# Patient Record
Sex: Female | Born: 1955 | Race: White | Hispanic: No | Smoking: Never smoker
Health system: Southern US, Community
[De-identification: ages and names within clinical notes are randomized; demographics above are authoritative.]

## PROBLEM LIST (undated history)

## (undated) DIAGNOSIS — N764 Abscess of vulva: Secondary | ICD-10-CM

## (undated) DIAGNOSIS — N946 Dysmenorrhea, unspecified: Secondary | ICD-10-CM

## (undated) DIAGNOSIS — N926 Irregular menstruation, unspecified: Secondary | ICD-10-CM

## (undated) HISTORY — DX: Dysmenorrhea, unspecified: N94.6

## (undated) HISTORY — DX: Abscess of vulva: N76.4

## (undated) HISTORY — DX: Irregular menstruation, unspecified: N92.6

---

## 1998-06-15 ENCOUNTER — Ambulatory Visit (HOSPITAL_COMMUNITY): Admission: RE | Admit: 1998-06-15 | Discharge: 1998-06-15 | Payer: Self-pay | Admitting: *Deleted

## 1998-06-30 HISTORY — PX: BREAST SURGERY: SHX581

## 1998-07-30 ENCOUNTER — Ambulatory Visit (HOSPITAL_COMMUNITY): Admission: RE | Admit: 1998-07-30 | Discharge: 1998-07-30 | Payer: Self-pay | Admitting: *Deleted

## 1999-06-02 ENCOUNTER — Other Ambulatory Visit: Admission: RE | Admit: 1999-06-02 | Discharge: 1999-06-02 | Payer: Self-pay | Admitting: Obstetrics and Gynecology

## 1999-06-22 ENCOUNTER — Other Ambulatory Visit: Admission: RE | Admit: 1999-06-22 | Discharge: 1999-06-22 | Payer: Self-pay | Admitting: Obstetrics and Gynecology

## 2000-06-28 ENCOUNTER — Other Ambulatory Visit: Admission: RE | Admit: 2000-06-28 | Discharge: 2000-06-28 | Payer: Self-pay | Admitting: Obstetrics and Gynecology

## 2001-06-28 ENCOUNTER — Other Ambulatory Visit: Admission: RE | Admit: 2001-06-28 | Discharge: 2001-06-28 | Payer: Self-pay | Admitting: Obstetrics and Gynecology

## 2002-07-18 ENCOUNTER — Other Ambulatory Visit: Admission: RE | Admit: 2002-07-18 | Discharge: 2002-07-18 | Payer: Self-pay | Admitting: Obstetrics and Gynecology

## 2003-07-29 ENCOUNTER — Other Ambulatory Visit: Admission: RE | Admit: 2003-07-29 | Discharge: 2003-07-29 | Payer: Self-pay | Admitting: Obstetrics and Gynecology

## 2004-08-16 ENCOUNTER — Other Ambulatory Visit: Admission: RE | Admit: 2004-08-16 | Discharge: 2004-08-16 | Payer: Self-pay | Admitting: Obstetrics and Gynecology

## 2005-01-05 HISTORY — PX: ENDOMETRIAL BIOPSY: SHX622

## 2005-05-16 ENCOUNTER — Encounter (INDEPENDENT_AMBULATORY_CARE_PROVIDER_SITE_OTHER): Payer: Self-pay | Admitting: *Deleted

## 2005-05-16 ENCOUNTER — Ambulatory Visit (HOSPITAL_BASED_OUTPATIENT_CLINIC_OR_DEPARTMENT_OTHER): Admission: RE | Admit: 2005-05-16 | Discharge: 2005-05-16 | Payer: Self-pay | Admitting: Obstetrics and Gynecology

## 2005-05-16 ENCOUNTER — Ambulatory Visit (HOSPITAL_COMMUNITY): Admission: RE | Admit: 2005-05-16 | Discharge: 2005-05-16 | Payer: Self-pay | Admitting: Obstetrics and Gynecology

## 2005-05-16 HISTORY — PX: CERVICAL POLYPECTOMY: SHX88

## 2005-05-16 HISTORY — PX: TUBAL LIGATION: SHX77

## 2005-08-17 ENCOUNTER — Other Ambulatory Visit: Admission: RE | Admit: 2005-08-17 | Discharge: 2005-08-17 | Payer: Self-pay | Admitting: Obstetrics & Gynecology

## 2006-01-03 ENCOUNTER — Ambulatory Visit (HOSPITAL_COMMUNITY): Admission: RE | Admit: 2006-01-03 | Discharge: 2006-01-03 | Payer: Self-pay | Admitting: Family Medicine

## 2006-08-22 ENCOUNTER — Other Ambulatory Visit: Admission: RE | Admit: 2006-08-22 | Discharge: 2006-08-22 | Payer: Self-pay | Admitting: Obstetrics and Gynecology

## 2007-09-20 ENCOUNTER — Other Ambulatory Visit: Admission: RE | Admit: 2007-09-20 | Discharge: 2007-09-20 | Payer: Self-pay | Admitting: Obstetrics & Gynecology

## 2008-05-30 HISTORY — PX: COLONOSCOPY: SHX174

## 2008-06-16 ENCOUNTER — Ambulatory Visit (HOSPITAL_COMMUNITY): Admission: RE | Admit: 2008-06-16 | Discharge: 2008-06-16 | Payer: Self-pay | Admitting: Internal Medicine

## 2008-06-16 ENCOUNTER — Ambulatory Visit: Payer: Self-pay | Admitting: Internal Medicine

## 2008-10-06 ENCOUNTER — Other Ambulatory Visit: Admission: RE | Admit: 2008-10-06 | Discharge: 2008-10-06 | Payer: Self-pay | Admitting: Obstetrics & Gynecology

## 2010-04-29 HISTORY — PX: ANKLE ARTHODESIS W/ ARTHROSCOPY: SUR63

## 2010-10-12 NOTE — Op Note (Signed)
NAME:  Heather Moore, Heather Moore                ACCOUNT NO.:  [id number]   MEDICAL RECORD NO.:  [id number]          PATIENT TYPE:  AMB   LOCATION:  DAY                           FACILITY:  [facility]   PHYSICIAN:  R. Roetta Sessions, M.D. DATE OF BIRTH:  Feb 21, 1956   DATE OF PROCEDURE:  06/16/2008  DATE OF DISCHARGE:                               OPERATIVE REPORT   Ileocolonoscopy screening.   INDICATIONS FOR PROCEDURE:  A 55 year old lady void of any lower GI  tract symptoms sent over at the request of Dr. Karleen Hampshire for  colorectal cancer screening.  She has never had her lower GI tract  evaluated.  There is no family history of colon polyps or colon cancer.  Colonoscopy is now being done to establish screening maneuver.  Risks,  benefits, alternatives, and limitations to this approach have been  reviewed.  Please see the documentation in the medical record.   PROCEDURE NOTE:  O2 saturation, blood pressure, pulse, and respirations  were monitored throughout the entire procedure.   CONSCIOUS SEDATION:  Versed 4 mg IV and Demerol 75 mg IV in divided  doses.   INSTRUMENT:  Pentax video chip system.   FINDINGS:  Digital rectal exam revealed no abnormalities.  Endoscopic  findings:  The prep was good.  Colon:  Colonic mucosa was surveyed from  the rectosigmoid junction through the left transverse, right colon to  the appendiceal orifice, ileocecal valve, and cecum.  These structures  were well seen and photographed for the record.  The terminal ileum was  intubated to 5 cm.  From this level, the scope was slowly and cautiously  withdrawn.  All previously mentioned mucosal surfaces were again seen.  The colonic mucosa appeared entirely normal as did the terminal ileal  mucosa.  The scope was pulled down to the rectum, where thorough  examination of the rectal mucosa including retroflexed view of the anal  verge demonstrated only a couple of anal papilla.  The patient tolerated  the procedure well  and was reactive in Endoscopy.   IMPRESSION:  Anal papilla, otherwise normal rectum, colon, and terminal  ileum.   RECOMMENDATIONS:  Repeat screening colonoscopy in 10 years.      Jonathon Bellows, M.D.  Electronically Signed     RMR/MEDQ  D:  06/16/2008  T:  06/16/2008  Job:  [id number]   cc:   Kirk Ruths, M.D.  Fax: [phone]

## 2010-10-15 NOTE — Op Note (Signed)
NAME:  Heather Moore, Heather Moore                ACCOUNT NO.:  [id number]   MEDICAL RECORD NO.:  [id number]          PATIENT TYPE:  AMB   LOCATION:  [facility]                         FACILITY:  [facility]   PHYSICIAN:  Cynthia P. Romine, M.D.DATE OF BIRTH:  Sep 12, 1955   DATE OF PROCEDURE:  05/16/2005  DATE OF DISCHARGE:                                 OPERATIVE REPORT   PREOPERATIVE DIAGNOSIS:  Menorrhagia, desires attempt at permanent surgical  sterilization.   POSTOPERATIVE DIAGNOSIS:  Menorrhagia, desires attempt at permanent surgical  sterilization.   PROCEDURE:  Endometrial ablation using the HydroTherm ablator and  laparoscopic Falope ring bilateral tubal sterilization procedure.   SURGEON:  Dr. Purvis Sheffield   ANESTHESIA:  General endotracheal.   ESTIMATED BLOOD LOSS:  Minimal.   COMPLICATIONS:  None.   PROCEDURE:  The patient was taken to the operating room and after the  induction of adequate general endotracheal anesthesia, was placed in the  dorsolithotomy position and prepped and draped in the usual fashion.  The  bladder was drained with a red rubber catheter.  A  posterior weighted and  anterior Sims retractor were placed, and the cervix was grasped on its  anterior lip with a single-tooth tenaculum.  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  Knowing this, her cervix was injected  with Pitressin, a solution of 20 mL of Pitressin to 20 mL of normal saline,  and approximately 7 mL was injected directly into the cervical stroma.  Attempt was made to pass the sound to the endocervical os, and it would not  pass.  The endocervix was dilated to a #23 Shawnie Pons, and the hysteroscope was  introduced and could not pass through the endocervix.  The uterus took a  turn anteriorly and was anteflexed, and it made difficult for the scope to  pass into the endometrial cavity.  The cervix was dilated up  to a #25 Shawnie Pons,  and still the hysteroscope would not pass.  Therefore, attempts were  stopped, and the 25 Pratt dilator was left in the cervix while laparoscopic  tubal sterilization was done.  Attention was next turned to the abdomen.  A  subumbilical incision was made.  The Veress needle was inserted into the  peritoneal space.  Pneumoperitoneum was created using 2 L of CO2 using a  Stryker automatic insufflator.  A 10/11 trocar was then inserted and found  to be preperitoneal.  The trocar was removed with.  The Veress needle was  again inserted into the peritoneal space, and the abdomen was insufflated  with 2 L of CO2.  The 11 mm trocar was then reintroduced into the abdomen  and was in the peritoneal space.  The uterus was elevated.  The fallopian  tubes were identified.  The left fallopian tube was traced to its fimbriated  end.  It was elevated in the midportion, and the Falope ring was placed.  A  good knuckle of tube was noted to be contained within the ring, and  no  bleeding was encountered.  The procedure was repeated on the patient's  right, identifying the tube and tracing it its fimbriated end.  A good  knuckle of tube was noted to be contained within the ring, and no bleeding  was encountered.  The ovaries in the cul-de-sac, both anterior and posterior  appeared normal.  The Falope ring applier was then removed, and attention  was next turned back to the cervix.  Attempt was made to pass the  hysteroscope after taking out the [employer], and it still would not pass.  The cervix was dilated to a #27, and the scope would not pass.  It was  dilated to a #29, and finally the scope did pass into the endometrial  cavity.  Proper positioning was noted by noting the bilateral tubal ostia.  The scope was withdrawn to just inside the endocervical os; endometrial  ablation was carried out using the HydroTherm ablator in the usual  technique.  Photographic documentation was taken before  and after the  ablation.  After the ablation, the instruments were removed from the vagina.  Attention was next turned back to the abdomen.  The trocar sleeves were  removed.  The incisions were closed subcuticularly with 3-0 Vicryl Rapide,  and the procedure was terminated.  The patient was taken to recovery room in  satisfactory condition.           ______________________________  Edwena Felty. Romine, M.D.     CPR/MEDQ  D:  05/16/2005  T:  05/17/2005  Job:  [id number]

## 2012-09-20 ENCOUNTER — Encounter: Payer: Self-pay | Admitting: Nurse Practitioner

## 2012-09-20 ENCOUNTER — Ambulatory Visit (INDEPENDENT_AMBULATORY_CARE_PROVIDER_SITE_OTHER): Payer: 59 | Admitting: Nurse Practitioner

## 2012-09-20 VITALS — BP 120/70 | HR 78 | Resp 16 | Ht 63.0 in | Wt 175.0 lb

## 2012-09-20 DIAGNOSIS — L0291 Cutaneous abscess, unspecified: Secondary | ICD-10-CM

## 2012-09-20 DIAGNOSIS — L0390 Cellulitis, unspecified: Secondary | ICD-10-CM

## 2012-09-20 MED ORDER — DOXYCYCLINE HYCLATE 100 MG PO CAPS: 100.0000 mg | ORAL_CAPSULE | Freq: Two times a day (BID) | ORAL | Status: DC

## 2012-09-20 NOTE — Patient Instructions (Addendum)
Sitz baths with warm H20 3-4 times a day.  May use epsom salts. Gentle pill rolling motion to remove the exudate.  Out of work today and tomorrow. Antibiotic Doxycycline 100 mg 2 X day Recheck with Patty or Dr. Farrel Gobble next Wednesday  Or Thursday

## 2012-09-20 NOTE — Progress Notes (Signed)
Subjective:     Patient ID: Heather Moore, female   DOB: May 18, 1956, 57 y.o.   MRN: [id number]  HPI Comments: Patient states about a week ago noted a 'lesion' that she thought was an infected hair follicle on left vulva. Now on right about 5 days ago. Now feels like more lesions internally. No pain with urination. Feels swollen and uncomfortable. Last sexually active about 1- 11/2 month ago.  No fever chills. No urinary symptoms.this am also has a lesion right thumb.   Recently diagnosed with impetigo in right nostril in December and treated with antibiotic cream and oral antibiotics. No nasal culture was done.      Review of Systems  Constitutional: Negative.   Respiratory: Negative.   Cardiovascular: Negative.   Gastrointestinal: Negative.   Genitourinary: Positive for genital sores, vaginal pain and menstrual problem. Negative for dysuria, frequency, flank pain, vaginal bleeding, vaginal discharge and difficulty urinating.       LMP 05/2009. Usual vaso symptoms. Vaginal pain/ discomfort with lesions.  Psychiatric/Behavioral: Negative.        Objective:   Physical Exam  Constitutional: She appears well-developed and well-nourished.  Cardiovascular: Normal rate.   Pulmonary/Chest: Effort normal.  Abdominal: Soft. Bowel sounds are normal. She exhibits no distension and no mass. There is no tenderness. There is no rebound and no guarding.  Genitourinary:     A fullness of left vulva lesion with borders that are firm about 1 X 1.5 cm in size. No exudate. Area with swelling and induration about 3 cm total. Smaller lesion on right labia about 0.5 cm that is also firm without exudate.  Isolated other lesions that are healing and no induration. One area top of left inner leg with a bullseye look.        Assessment:    Cellulitis and abscess left vulva R/O MRSA Allergy to Sulfa    Plan:     Patient was seen by Dr. Farrel Gobble who did I & D of lesion Patient given instructions for sitz  bath.  She is given note for work today ad tomorrow.  Will recheck next week.  Will follow with wound culture results.  May take NSAID'S prn pain.Started on Doxycycline 100 mg BID.   Reviewed, TL   Addendum- I was asked to evaluate pt, findings were as noted above.  The right labia majora had a firm, mobile lesion approx 0.5cm, the left had a larger mass associated with general swelling but no induration or warmth.  The finding were discussed with the pt, suggest and I&D of the larger lesion for both culture and sensitivity and comfort.  Risks and benefits reviewed and accepted.   The labia was anesthetized with 2% xylocaine jelly, cleansed with betadine and a single stab with #11 blade results in expression of moderate amount of thick grey discharge no malodor.  A sample of the discharge was sent for C&S.  The lesion was drained of the Liquifed material. Pt tolerated well. Recommend sitz bath with pill rolling to express remaining exudate. Doxycycline called in. F/U 1w  TL

## 2012-09-23 LAB — WOUND CULTURE

## 2012-09-24 ENCOUNTER — Encounter: Payer: Self-pay | Admitting: Nurse Practitioner

## 2012-09-26 ENCOUNTER — Ambulatory Visit (INDEPENDENT_AMBULATORY_CARE_PROVIDER_SITE_OTHER): Payer: 59 | Admitting: Gynecology

## 2012-09-26 VITALS — BP 112/64 | Resp 14 | Wt 175.0 lb

## 2012-09-26 DIAGNOSIS — N9089 Other specified noninflammatory disorders of vulva and perineum: Secondary | ICD-10-CM

## 2012-09-26 DIAGNOSIS — N907 Vulvar cyst: Secondary | ICD-10-CM

## 2012-09-26 NOTE — Progress Notes (Signed)
Subjective:     Patient ID: Heather Moore, female   DOB: Sep 27, 1955, 57 y.o.   MRN: [id number]  HPI Comments: Pt here for f/u, completed antibiotics, had been doing sitz baths as recommended. overall feels great reports all lesions have resolved    Review of Systems Per HPI    Objective:   Physical Exam  Constitutional: She appears well-developed and well-nourished.  Genitourinary:    There is no rash, tenderness or lesion on the right labia. There is no rash, tenderness or lesion on the left labia.       Assessment:     Staph aureus infection of the labia     Plan:     Treated, may resume usual activites

## 2012-11-02 ENCOUNTER — Ambulatory Visit (INDEPENDENT_AMBULATORY_CARE_PROVIDER_SITE_OTHER): Payer: 59 | Admitting: Nurse Practitioner

## 2012-11-02 ENCOUNTER — Encounter: Payer: Self-pay | Admitting: Nurse Practitioner

## 2012-11-02 VITALS — BP 130/62 | HR 72 | Resp 14 | Ht 62.5 in | Wt 176.8 lb

## 2012-11-02 DIAGNOSIS — Z01419 Encounter for gynecological examination (general) (routine) without abnormal findings: Secondary | ICD-10-CM

## 2012-11-02 DIAGNOSIS — N764 Abscess of vulva: Secondary | ICD-10-CM

## 2012-11-02 DIAGNOSIS — Z0000 Encounter for general adult medical examination without abnormal findings: Secondary | ICD-10-CM

## 2012-11-02 LAB — POCT URINALYSIS DIPSTICK
Leukocytes, UA: NEGATIVE
Urobilinogen, UA: NEGATIVE
pH, UA: 6

## 2012-11-02 NOTE — Patient Instructions (Signed)

## 2012-11-02 NOTE — Progress Notes (Signed)
57 y.o. G0P0 Married Caucasian Fe here for annual exam.  Recent labial cyst with I & D on 4/30 by Dr. Farrel Gobble is  now resolved. No other new diagnosis or problems.  Rare vaso symptoms that are tolerable. 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  No LMP recorded. Patient is postmenopausal.          Sexually active: yes  The current method of family planning is status post menopausal.    Exercising: yes  walking  Smoker:  no  Health Maintenance: Pap:  11/01/2011  Normal with negative HR HPV MMG:  10/24/2012 normal Colonoscopy:  05/2008 normal repeat in 10 yrs. BMD:   Never  TDaP: 10/15/10 Labs: Hgb-13.0   reports that she has never smoked. She has never used smokeless tobacco. She reports that she drinks about 0.5 ounces of alcohol per week. She reports that she does not use illicit drugs.  Past Medical History  Diagnosis Date  . Dysmenorrhea   . Irregular menses     Past Surgical History  Procedure Laterality Date  . Ankle arthodesis w/ arthroscopy Right 04/2010  . Colonoscopy  05/2008  . Tubal ligation  05/16/05  . Endometrial biopsy  01/05/2005    Endo polyp with fragmented endo breakdown  . Cervical polypectomy  05/16/2005    benign  . Breast surgery Left 06/1998    breast biopsy -benign    Current Outpatient Prescriptions  Medication Sig Dispense Refill  . acetaminophen (TYLENOL) 325 MG tablet Take 650 mg by mouth every 6 (six) hours as needed for pain.      Marland Kitchen aspirin 81 MG tablet Take 81 mg by mouth as needed for pain.      . calcium carbonate (TUMS) 500 MG chewable tablet Chew 1 tablet by mouth daily.      . Cholecalciferol (VITAMIN D) 2000 UNITS CAPS Take by mouth daily.      . Multiple Vitamins-Minerals (MULTIVITAMIN PO) Take by mouth daily.      . naproxen (NAPROSYN) 500 MG tablet Take 500 mg by mouth 2 (two) times daily with a meal.      . Simethicone (GAS-X PO) Take by mouth  as needed.       No current facility-administered medications for this visit.    Family History  Problem Relation Age of Onset  . Heart failure Mother     ROS:  Pertinent items are noted in HPI.  Otherwise, a comprehensive ROS was negative.  Exam:   BP 130/62  Pulse 72  Resp 14  Ht 5' 2.5" (1.588 m)  Wt 176 lb 12.8 oz (80.196 kg)  BMI 31.8 kg/m2 Height: 5' 2.5" (158.8 cm)  Ht Readings from Last 3 Encounters:  11/02/12 5' 2.5" (1.588 m)  09/20/12 5\' 3"  (1.6 m)    General appearance: alert, cooperative and appears stated age Head: Normocephalic, without obvious abnormality, atraumatic Neck: no adenopathy, supple, symmetrical, trachea midline and thyroid normal to inspection and palpation Lungs: clear to auscultation bilaterally Breasts: normal appearance, no masses or tenderness Heart: regular rate and rhythm Abdomen: soft, non-tender; no masses,  no organomegaly Extremities: extremities normal, atraumatic, no cyanosis or edema Skin: Skin color, texture, turgor normal. No rashes or lesions Lymph nodes: Cervical, supraclavicular, and axillary nodes normal. No abnormal inguinal nodes palpated Neurologic: Grossly normal   Pelvic: External genitalia:  no lesions  Urethra:  normal appearing urethra with no masses, tenderness or lesions              Bartholin's and Skene's: normal                 Vagina: normal appearing vagina with normal color and discharge, no lesions              Cervix: anteverted              Pap taken: no Bimanual Exam:  Uterus:  normal size, contour, position, consistency, mobility, non-tender              Adnexa: no mass, fullness, tenderness               Rectovaginal: Confirms               Anus:  normal sphincter tone, no lesions  A:  Well Woman with normal exam  Postmenopausal no HRT  Recent left vulvar cyst with resolution 09/26/12  P:   Pap smear as per guidelines   Mammogram due 5/15  counseled on breast self exam, adequate  intake of calcium and vitamin D, diet and exercise  return annually or prn  An After Visit Summary was printed and given to the patient.

## 2012-11-05 ENCOUNTER — Encounter: Payer: Self-pay | Admitting: Nurse Practitioner

## 2012-11-05 DIAGNOSIS — N764 Abscess of vulva: Secondary | ICD-10-CM | POA: Insufficient documentation

## 2012-11-05 NOTE — Progress Notes (Signed)
Reviewed personally.  M. Suzanne Angas Isabell, MD.  

## 2013-04-04 ENCOUNTER — Other Ambulatory Visit: Payer: Self-pay

## 2013-11-05 ENCOUNTER — Ambulatory Visit: Payer: 59 | Admitting: Nurse Practitioner

## 2014-01-27 ENCOUNTER — Ambulatory Visit (INDEPENDENT_AMBULATORY_CARE_PROVIDER_SITE_OTHER): Payer: 59 | Admitting: Nurse Practitioner

## 2014-01-27 ENCOUNTER — Encounter: Payer: Self-pay | Admitting: Nurse Practitioner

## 2014-01-27 VITALS — BP 120/84 | HR 64 | Ht 63.0 in | Wt 176.0 lb

## 2014-01-27 DIAGNOSIS — Z1211 Encounter for screening for malignant neoplasm of colon: Secondary | ICD-10-CM

## 2014-01-27 DIAGNOSIS — Z01419 Encounter for gynecological examination (general) (routine) without abnormal findings: Secondary | ICD-10-CM

## 2014-01-27 DIAGNOSIS — Z0000 Encounter for general adult medical examination without abnormal findings: Secondary | ICD-10-CM

## 2014-01-27 LAB — POCT URINALYSIS DIPSTICK
BILIRUBIN UA: NEGATIVE
Blood, UA: NEGATIVE
GLUCOSE UA: NEGATIVE
Ketones, UA: NEGATIVE
LEUKOCYTES UA: NEGATIVE
NITRITE UA: NEGATIVE
Protein, UA: NEGATIVE
Urobilinogen, UA: NEGATIVE
pH, UA: 8.5

## 2014-01-27 LAB — COMPREHENSIVE METABOLIC PANEL
ALT: 12 U/L (ref 0–35)
AST: 16 U/L (ref 0–37)
Albumin: 4.2 g/dL (ref 3.5–5.2)
Alkaline Phosphatase: 67 U/L (ref 39–117)
BUN: 13 mg/dL (ref 6–23)
CHLORIDE: 102 meq/L (ref 96–112)
CO2: 30 mEq/L (ref 19–32)
Calcium: 9.8 mg/dL (ref 8.4–10.5)
Creat: 0.81 mg/dL (ref 0.50–1.10)
Glucose, Bld: 84 mg/dL (ref 70–99)
Potassium: 4 mEq/L (ref 3.5–5.3)
Sodium: 142 mEq/L (ref 135–145)
Total Bilirubin: 0.3 mg/dL (ref 0.2–1.2)
Total Protein: 6.7 g/dL (ref 6.0–8.3)

## 2014-01-27 LAB — HEMOGLOBIN, FINGERSTICK: HEMOGLOBIN, FINGERSTICK: 13 g/dL (ref 12.0–16.0)

## 2014-01-27 LAB — LIPID PANEL
Cholesterol: 169 mg/dL (ref 0–200)
HDL: 63 mg/dL (ref >39)
LDL CALC: 88 mg/dL (ref 0–99)
Total CHOL/HDL Ratio: 2.7 Ratio
Triglycerides: 92 mg/dL (ref <150)
VLDL: 18 mg/dL (ref 0–40)

## 2014-01-27 LAB — TSH: TSH: 1.548 u[IU]/mL (ref 0.350–4.500)

## 2014-01-27 NOTE — Progress Notes (Signed)
Patient ID: Heather Moore, female   DOB: [birth year], 58 y.o.   MRN: [id number] 58 y.o. G0P0 Married Caucasian Fe here for annual exam.  No new health problems.  Caring for mother who is 6.  Patient's last menstrual period was 05/16/2005.          Sexually active: yes  The current method of family planning is status post menopausal.  Exercising: yes walking at work, walking dog each afternoon Smoker: no   Health Maintenance:  Pap: 11/01/2011 Normal with negative HR HPV  MMG: 10/28/13, Bi-Rads 1: negative   Colonoscopy: 05/2008 normal repeat in 10 yrs.  BMD: Never  TDaP: 10/15/10  Labs:  HB:  13.0  Urine:  Negative    reports that she has never smoked. She has never used smokeless tobacco. She reports that she drinks about .5 ounces of alcohol per week. She reports that she does not use illicit drugs.  Past Medical History  Diagnosis Date  . Dysmenorrhea   . Irregular menses   . Vulvar abscess 11/05/2012    Culture was + for Staph Aureus    Past Surgical History  Procedure Laterality Date  . Ankle arthodesis w/ arthroscopy Right 04/2010  . Colonoscopy  05/2008  . Tubal ligation  05/16/05  . Endometrial biopsy  01/05/2005    Endo polyp with fragmented endo breakdown  . Cervical polypectomy  05/16/2005    benign  . Breast surgery Left 06/1998    breast biopsy -benign    Current Outpatient Prescriptions  Medication Sig Dispense Refill  . acetaminophen (TYLENOL) 325 MG tablet Take 650 mg by mouth every 6 (six) hours as needed for pain.      Marland Kitchen aspirin 81 MG tablet Take 81 mg by mouth as needed for pain.      . calcium carbonate (TUMS) 500 MG chewable tablet Chew 1 tablet by mouth daily.      . Cholecalciferol (VITAMIN D) 2000 UNITS CAPS Take by mouth daily.      . Fish Oil-Krill Oil CAPS Take by mouth.      . Multiple Vitamins-Minerals (MULTIVITAMIN PO) Take by mouth daily.      . naproxen (NAPROSYN) 500 MG tablet Take 500 mg by mouth 2 (two) times daily with a meal.      . Simethicone  (GAS-X PO) Take by mouth as needed.       No current facility-administered medications for this visit.    Family History  Problem Relation Age of Onset  . Heart failure Mother   . Leukemia Father     ROS:  Pertinent items are noted in HPI.  Otherwise, a comprehensive ROS was negative.  Exam:   BP 120/84  Pulse 64  Ht  (1.6 m)  Wt 176 lb (79.833 kg)  BMI 31.18 kg/m2  LMP 05/16/2005 Height:  (160 cm)  Ht Readings from Last 3 Encounters:  01/27/14  (1.6 m)  11/02/12 5' 2.5" (1.588 m)  09/20/12  (1.6 m)    General appearance: alert, cooperative and appears stated age Head: Normocephalic, without obvious abnormality, atraumatic Neck: no adenopathy, supple, symmetrical, trachea midline and thyroid normal to inspection and palpation Lungs: clear to auscultation bilaterally Breasts: normal appearance, no masses or tenderness Heart: regular rate and rhythm Abdomen: soft, non-tender; no masses,  no organomegaly Extremities: extremities normal, atraumatic, no cyanosis or edema Skin: Skin color, texture, turgor normal. No rashes or lesions Lymph nodes: Cervical, supraclavicular, and axillary nodes normal. No abnormal  inguinal nodes palpated Neurologic: Grossly normal   Pelvic: External genitalia:  no lesions              Urethra:  normal appearing urethra with no masses, tenderness or lesions              Bartholin's and Skene's: normal                 Vagina: normal appearing vagina with normal color and discharge, no lesions              Cervix: anteverted              Pap taken: No. Bimanual Exam:  Uterus:  normal size, contour, position, consistency, mobility, non-tender              Adnexa: no mass, fullness, tenderness               Rectovaginal: Confirms               Anus:  normal sphincter tone, no lesions  A:  Well Woman with normal exam  Postmenopausal no HRT   P:   Reviewed health and wellness pertinent to exam  Pap smear not taken  today  IFOB  Given today  Mammogram is due 6/16  Will follow with labs  Counseled on breast self exam, mammography screening, adequate intake of calcium and vitamin D, diet and exercise return annually or prn  An After Visit Summary was printed and given to the patient.

## 2014-01-27 NOTE — Patient Instructions (Signed)

## 2014-01-27 NOTE — Progress Notes (Signed)
Encounter reviewed by Dr. Brook Silva.  

## 2014-01-28 LAB — VITAMIN D 25 HYDROXY (VIT D DEFICIENCY, FRACTURES): VIT D 25 HYDROXY: 53 ng/mL (ref 30–89)

## 2014-07-30 ENCOUNTER — Telehealth: Payer: Self-pay | Admitting: Nurse Practitioner

## 2014-07-30 NOTE — Telephone Encounter (Signed)
Left message to call Lewanda Perea at [phone]. 

## 2014-07-30 NOTE — Telephone Encounter (Signed)
Patient calling requesting to be seen for an infected hair "down there."

## 2014-07-30 NOTE — Telephone Encounter (Signed)
Spoke with patient. Patient states she has an "ingrown hair that has a red spot and hard knot that came up yesterday." States that the area is "slightly" painful to the touch. Advised patient will need to be seen for evaluation in office. Patient is agreeable. Offered patient appointment today with Heather Moore CNM but patient declines. Appointment scheduled for tomorrow at 10:15am with Heather FranklinPatricia Rolen-Grubb, FNP. Patient is agreeable to date and time.  Routing to provider for final review. Patient agreeable to disposition. Will close encounter

## 2014-07-31 ENCOUNTER — Ambulatory Visit (INDEPENDENT_AMBULATORY_CARE_PROVIDER_SITE_OTHER): Payer: 59 | Admitting: Nurse Practitioner

## 2014-07-31 ENCOUNTER — Encounter: Payer: Self-pay | Admitting: Nurse Practitioner

## 2014-07-31 VITALS — BP 120/84 | HR 68 | Ht 63.0 in | Wt 182.0 lb

## 2014-07-31 DIAGNOSIS — L03818 Cellulitis of other sites: Secondary | ICD-10-CM | POA: Diagnosis not present

## 2014-07-31 DIAGNOSIS — L723 Sebaceous cyst: Secondary | ICD-10-CM

## 2014-07-31 MED ORDER — CEPHALEXIN 500 MG PO CAPS: 500.0000 mg | ORAL_CAPSULE | Freq: Four times a day (QID) | ORAL | Status: DC

## 2014-07-31 NOTE — Patient Instructions (Signed)
Epidermal Cyst An epidermal cyst is sometimes called a sebaceous cyst, epidermal inclusion cyst, or infundibular cyst. These cysts usually contain a substance that looks "pasty" or "cheesy" and may have a bad smell. This substance is a protein called keratin. Epidermal cysts are usually found on the face, neck, or trunk. They may also occur in the vaginal area or other parts of the genitalia of both men and women. Epidermal cysts are usually small, painless, slow-growing bumps or lumps that move freely under the skin. It is important not to try to pop them. This may cause an infection and lead to tenderness and swelling. CAUSES  Epidermal cysts may be caused by a deep penetrating injury to the skin or a plugged hair follicle, often associated with acne. SYMPTOMS  Epidermal cysts can become inflamed and cause:  Redness.  Tenderness.  Increased temperature of the skin over the bumps or lumps.  Grayish-white, bad smelling material that drains from the bump or lump. DIAGNOSIS  Epidermal cysts are easily diagnosed by your caregiver during an exam. Rarely, a tissue sample (biopsy) may be taken to rule out other conditions that may resemble epidermal cysts. TREATMENT   Epidermal cysts often get better and disappear on their own. They are rarely ever cancerous.  If a cyst becomes infected, it may become inflamed and tender. This may require opening and draining the cyst. Treatment with antibiotics may be necessary. When the infection is gone, the cyst may be removed with minor surgery.  Small, inflamed cysts can often be treated with antibiotics or by injecting steroid medicines.  Sometimes, epidermal cysts become large and bothersome. If this happens, surgical removal in your caregiver's office may be necessary. HOME CARE INSTRUCTIONS  Only take over-the-counter or prescription medicines as directed by your caregiver.  Take your antibiotics as directed. Finish them even if you start to feel  better. SEEK MEDICAL CARE IF:   Your cyst becomes tender, red, or swollen.  Your condition is not improving or is getting worse.  You have any other questions or concerns. MAKE SURE YOU:  Understand these instructions.  Will watch your condition.  Will get help right away if you are not doing well or get worse. Document Released: 04/16/2004 Document Revised: 08/08/2011 Document Reviewed: 11/22/2010 [employer] Patient Information 2015 [employer]. This information is not intended to replace advice given to you by your health care provider. Make sure you discuss any questions you have with your health care provider.  

## 2014-07-31 NOTE — Progress Notes (Signed)
Subjective:     Patient ID: Heather Moore, female   DOB: 31-Jul-1955, 59 y.o.   MRN: [id number]  HPI  This 59 yo WM Fe complains of painful area on left labia since Tuesday.  She thought this was again an infected hair follicle and applied warm compresses.  No fever or chills.  No other constitutional symptoms.  In past had a similar area 08/2012 on the right side which I&D was done.  No urinary symptoms, no vaginal discharge.  No other trauma,  Recent sinus symptoms with congestion.    Review of Systems  Constitutional: Negative.   Respiratory: Negative.   Cardiovascular: Negative.   Gastrointestinal: Negative.   Genitourinary: Positive for genital sores.  Musculoskeletal: Negative.   Skin: Positive for color change and wound.  Neurological: Negative.   Psychiatric/Behavioral: Negative.        Objective:   Physical Exam  Constitutional: She is oriented to person, place, and time. She appears well-developed and well-nourished. No distress.  Abdominal: Soft. She exhibits no distension. There is no tenderness. There is no rebound and no guarding.  Genitourinary:     Lesion left labia about 2 cm size with induration.  Maybe slight warm.  Center pustule without discharge. No lymphadenopathy.  Neurological: She is alert and oriented to person, place, and time.  Skin: Skin is warm and dry.  Psychiatric: She has a normal mood and affect. Her behavior is normal. Judgment and thought content normal.       Assessment:     Sebaceous cyst with induration    Plan:     Will start on Keflex 500 mg QID for a week  Warm compresses and sitz bath Recheck in 24 hours - may need I&D     Will get MD to look at this tomorrow

## 2014-08-01 ENCOUNTER — Ambulatory Visit (INDEPENDENT_AMBULATORY_CARE_PROVIDER_SITE_OTHER): Payer: 59 | Admitting: Obstetrics and Gynecology

## 2014-08-01 ENCOUNTER — Encounter: Payer: Self-pay | Admitting: Nurse Practitioner

## 2014-08-01 VITALS — BP 104/76 | HR 80 | Ht 63.0 in | Wt 183.6 lb

## 2014-08-01 DIAGNOSIS — N762 Acute vulvitis: Secondary | ICD-10-CM | POA: Diagnosis not present

## 2014-08-01 DIAGNOSIS — N764 Abscess of vulva: Secondary | ICD-10-CM | POA: Diagnosis not present

## 2014-08-01 NOTE — Progress Notes (Signed)
Subjective:     Patient ID: Heather BrooksSharon S Moore, female   DOB: 1955-12-24, 59 y.o.   MRN: [id number]  HPI HPI This 59 yo WM Fe complains of painful area on left labia since Tuesday. she comes in today for a 1 day recheck.  She thought this was again an infected hair follicle and applied warm compresses. No fever or chills. No other constitutional symptoms. In past had a similar area 08/2012 on the right side which I&D was done. No urinary symptoms, no vaginal discharge. No other trauma, Recent sinus symptoms with congestion.   Started Keflex yesterday and doing warm soaks with Epson salts.  Is slightly better.  No drainage.  No fevers.  Review of Systems    See HPI.  Objective:   Physical Exam Pelvic - left labia majora indurated and skin with erythema and tenderness.  Feels like there is a 2.5 cm mass of the superior, left labia majora. Right labia normal.  Urethra normal.  No inguinal nodes palpable.    Procedure - Incision and Drainage of Vulvar Abscess. Consent for procedure.  Sterile prep of labia with Hibiclens. Local 1% lidocaine, lot number [id number], Expiration 10/29/14. Scalpel used to create 1 cm incision over mass. No abscess noted.  Wound culture performed and sent to lab. Wound explored with hemostat - no loculations or abscess noted.  Just indurated tissue.  Wound dressed with sterile bandage. Minimal EBL.  No complications.      Assessment:     Left vulvar cellulitis.  Suspected vulvar abscess not identified on exploration of mass, which is just significant induration of the tissue.  On Keflex.    Plan:     Wound culture sent.  Continue Keflex 500 mg po qid to complete 7 day course. Use antibacterial soap and water to cleanse the wound. Follow up in 4 days, sooner as needed.  Call for fevers, increasing pain, or increasing size of affected area on the vulva.   After visit summary to patient.   15 minutes face to face time of which over 50% was spent  in counseling.

## 2014-08-01 NOTE — Patient Instructions (Signed)
Please finish your antibiotics.  Use antibacterial soap and water to cleanse the area during the weekend.  Call for increasing pain, fever, or increasing size of the infected area.

## 2014-08-03 ENCOUNTER — Encounter: Payer: Self-pay | Admitting: Obstetrics and Gynecology

## 2014-08-04 ENCOUNTER — Ambulatory Visit (INDEPENDENT_AMBULATORY_CARE_PROVIDER_SITE_OTHER): Payer: 59 | Admitting: Obstetrics and Gynecology

## 2014-08-04 ENCOUNTER — Encounter: Payer: Self-pay | Admitting: Obstetrics and Gynecology

## 2014-08-04 VITALS — BP 140/80 | HR 70 | Ht 63.0 in | Wt 184.8 lb

## 2014-08-04 DIAGNOSIS — N762 Acute vulvitis: Secondary | ICD-10-CM | POA: Diagnosis not present

## 2014-08-04 LAB — WOUND CULTURE
Gram Stain: NONE SEEN
Gram Stain: NONE SEEN

## 2014-08-04 MED ORDER — FLUCONAZOLE 150 MG PO TABS: 150.0000 mg | ORAL_TABLET | Freq: Once | ORAL | Status: DC

## 2014-08-04 MED ORDER — CEPHALEXIN 500 MG PO CAPS: 500.0000 mg | ORAL_CAPSULE | Freq: Four times a day (QID) | ORAL | Status: DC

## 2014-08-04 NOTE — Progress Notes (Signed)
Encounter reviewed by Dr. Harshika Mago Silva.  

## 2014-08-04 NOTE — Progress Notes (Signed)
Patient ID: Heather BrooksSharon S Curley, female   DOB: 12-Jun-1955, 59 y.o.   MRN: [id number] GYNECOLOGY  VISIT   HPI: 59 y.o.   Married  Caucasian  female   G0P0 with Patient's last menstrual period was 05/16/2005.   here for follow up visit.    Status post Incision and Drainage of left vulvar mass on 08/01/14. No abscess noted. Patient has been on Keflex 500 mg po qid for 5 days.  No fevers.  States the area is improving.  Wound culture showed a few staphylococcus aureus sensitive to Cefazolin and resistant to PCN.  Asking what causes this to occur.  Had similar episode in April 2014 and was treated with I and D and Doxycycline. Reports that she picks at skin lesions when they appear.  GYNECOLOGIC HISTORY: Patient's last menstrual period was 05/16/2005. Contraception:  Postmenopausal  Menopausal hormone therapy: none        OB History    Gravida Para Term Preterm AB TAB SAB Ectopic Multiple Living   0                  There are no active problems to display for this patient.   Past Medical History  Diagnosis Date  . Dysmenorrhea   . Irregular menses   . Vulvar abscess 11/05/2012    Culture was + for Staph Aureus    Past Surgical History  Procedure Laterality Date  . Ankle arthodesis w/ arthroscopy Right 04/2010  . Colonoscopy  05/2008  . Tubal ligation  05/16/05  . Endometrial biopsy  01/05/2005    Endo polyp with fragmented endo breakdown  . Cervical polypectomy  05/16/2005    benign  . Breast surgery Left 06/1998    breast biopsy -benign    Current Outpatient Prescriptions  Medication Sig Dispense Refill  . acetaminophen (TYLENOL) 325 MG tablet Take 650 mg by mouth every 6 (six) hours as needed for pain.    Marland Kitchen. aspirin 81 MG tablet Take 81 mg by mouth as needed for pain.    . calcium carbonate (TUMS) 500 MG chewable tablet Chew 1 tablet by mouth daily.    . cephALEXin (KEFLEX) 500 MG capsule Take 1 capsule (500 mg total) by mouth 4 (four) times daily. Take QID for 7 days. 28  capsule 0  . Cholecalciferol (VITAMIN D) 2000 UNITS CAPS Take by mouth daily.    . Fish Oil-Krill Oil CAPS Take by mouth.    . Multiple Vitamins-Minerals (MULTIVITAMIN PO) Take by mouth daily.    . naproxen (NAPROSYN) 500 MG tablet Take 500 mg by mouth 2 (two) times daily with a meal.    . Simethicone (GAS-X PO) Take by mouth as needed.     No current facility-administered medications for this visit.     ALLERGIES: Hydrocodone and Sulfa antibiotics  Family History  Problem Relation Age of Onset  . Heart failure Mother   . Leukemia Father     History   Social History  . Marital Status: Married    Spouse Name: N/A  . Number of Children: N/A  . Years of Education: N/A   Occupational History  . Not on file.   Social History Main Topics  . Smoking status: Never Smoker   . Smokeless tobacco: Never Used  . Alcohol Use: 0.6 oz/week    1 Standard drinks or equivalent per week  . Drug Use: No  . Sexual Activity: Yes    Birth Control/ Protection: Post-menopausal   Other Topics Concern  .  Not on file   Social History Narrative    ROS:  Pertinent items are noted in HPI.  PHYSICAL EXAMINATION:    BP 140/80 mmHg  Pulse 70  Ht  (1.6 m)  Wt 184 lb 12.8 oz (83.825 kg)  BMI 32.74 kg/m2  LMP 05/16/2005     General appearance: alert, cooperative and appears stated age   Pelvic: External genitalia:  Left labia majora and mons with 2 cm area less indurated and less erythema.  Opening in the skin of 0.75 mm.  Wound cleansed inside with H2O2 and sterile Q tip.  Sterile dressing placed and taped to vulva.            ASSESSMENT  Staph aureus vulvar cellulitis.  Improved. Sensitive to cefazolin and oxacillin but resistant to PCN.   PLAN  Continue Keflex 500 mg po qid for an additional 7 days.  Rx for Diflucan.  Can take 1 if starting having itching/burning and then repeat one at the end of the course of antibiotics. Use probiotics. Return for a recheck in 1 weeks.   An  After Visit Summary was printed and given to the patient.  __15____ minutes face to face time of which over 50% was spent in counseling.

## 2014-08-14 ENCOUNTER — Ambulatory Visit (INDEPENDENT_AMBULATORY_CARE_PROVIDER_SITE_OTHER): Payer: 59 | Admitting: Obstetrics and Gynecology

## 2014-08-14 ENCOUNTER — Encounter: Payer: Self-pay | Admitting: Obstetrics and Gynecology

## 2014-08-14 VITALS — BP 114/74 | HR 70 | Resp 16 | Ht 63.0 in | Wt 182.0 lb

## 2014-08-14 DIAGNOSIS — N762 Acute vulvitis: Secondary | ICD-10-CM

## 2014-08-14 NOTE — Progress Notes (Signed)
GYNECOLOGY  VISIT   HPI: 59 y.o.   Married  Caucasian  female   G0P0 with Patient's last menstrual period was 05/16/2005.   here for  Vulvar recheck.  States she is much better.  Still has one area that is a little bit firm.  Finished Keflex.  No fevers.  No vaginal itching.   Wound culture was negative for MRSA.  GYNECOLOGIC HISTORY: Patient's last menstrual period was 05/16/2005. Contraception:  Post menopausal  Menopausal hormone therapy: none        OB History    Gravida Para Term Preterm AB TAB SAB Ectopic Multiple Living   0                  There are no active problems to display for this patient.   Past Medical History  Diagnosis Date  . Dysmenorrhea   . Irregular menses   . Vulvar abscess 11/05/2012    Culture was + for Staph Aureus    Past Surgical History  Procedure Laterality Date  . Ankle arthodesis w/ arthroscopy Right 04/2010  . Colonoscopy  05/2008  . Tubal ligation  05/16/05  . Endometrial biopsy  01/05/2005    Endo polyp with fragmented endo breakdown  . Cervical polypectomy  05/16/2005    benign  . Breast surgery Left 06/1998    breast biopsy -benign    Current Outpatient Prescriptions  Medication Sig Dispense Refill  . acetaminophen (TYLENOL) 325 MG tablet Take 650 mg by mouth every 6 (six) hours as needed for pain.    Marland Kitchen aspirin 81 MG tablet Take 81 mg by mouth as needed for pain.    . calcium carbonate (TUMS) 500 MG chewable tablet Chew 1 tablet by mouth daily.    . cephALEXin (KEFLEX) 500 MG capsule Take 1 capsule (500 mg total) by mouth 4 (four) times daily. Take QID for 7 days. 28 capsule 0  . Cholecalciferol (VITAMIN D) 2000 UNITS CAPS Take by mouth daily.    . Fish Oil-Krill Oil CAPS Take by mouth.    . fluconazole (DIFLUCAN) 150 MG tablet Take 1 tablet (150 mg total) by mouth once. Repeat at the end of course of antibiotics if itching symptoms are not completely resolved. 2 tablet 0  . Multiple Vitamins-Minerals (MULTIVITAMIN PO) Take by  mouth daily.    . naproxen (NAPROSYN) 500 MG tablet Take 500 mg by mouth 2 (two) times daily with a meal.    . Simethicone (GAS-X PO) Take by mouth as needed.     No current facility-administered medications for this visit.     ALLERGIES: Hydrocodone and Sulfa antibiotics  Family History  Problem Relation Age of Onset  . Heart failure Mother   . Leukemia Father     History   Social History  . Marital Status: Married    Spouse Name: N/A  . Number of Children: N/A  . Years of Education: N/A   Occupational History  . Not on file.   Social History Main Topics  . Smoking status: Never Smoker   . Smokeless tobacco: Never Used  . Alcohol Use: 0.6 oz/week    1 Standard drinks or equivalent per week  . Drug Use: No  . Sexual Activity: Yes    Birth Control/ Protection: Post-menopausal   Other Topics Concern  . Not on file   Social History Narrative    ROS:  Pertinent items are noted in HPI.  PHYSICAL EXAMINATION:    LMP 05/16/2005  General appearance: alert, cooperative and appears stated age   Pelvic: External genitalia:  no erythema of vulva.  Slight induration of the superior left labia majora.  No mass.  No fluctuance.              Urethra:  normal appearing urethra with no masses, tenderness or lesions               ASSESSMENT  Vulvar cellulitis.  Resolved.   PLAN   Discussed risk factors for cellulitis, breaks in the skin. No further antibiotics needed. OK to return to normal activities.  Return prn and for annual exam with Shirlyn GoltzPatty Grubb in September 2016.    An After Visit Summary was printed and given to the patient.  __10____ minutes face to face time of which over 50% was spent in counseling.

## 2014-08-15 ENCOUNTER — Encounter: Payer: Self-pay | Admitting: Obstetrics and Gynecology

## 2015-01-29 ENCOUNTER — Ambulatory Visit: Payer: 59 | Admitting: Nurse Practitioner

## 2015-02-10 ENCOUNTER — Ambulatory Visit (INDEPENDENT_AMBULATORY_CARE_PROVIDER_SITE_OTHER): Payer: 59 | Admitting: Nurse Practitioner

## 2015-02-10 ENCOUNTER — Encounter: Payer: Self-pay | Admitting: Nurse Practitioner

## 2015-02-10 VITALS — BP 114/76 | HR 68 | Ht 63.0 in | Wt 178.0 lb

## 2015-02-10 DIAGNOSIS — Z01419 Encounter for gynecological examination (general) (routine) without abnormal findings: Secondary | ICD-10-CM | POA: Diagnosis not present

## 2015-02-10 DIAGNOSIS — Z0000 Encounter for general adult medical examination without abnormal findings: Secondary | ICD-10-CM

## 2015-02-10 DIAGNOSIS — Z1211 Encounter for screening for malignant neoplasm of colon: Secondary | ICD-10-CM

## 2015-02-10 LAB — POCT URINALYSIS DIPSTICK
BILIRUBIN UA: NEGATIVE
Blood, UA: NEGATIVE
Glucose, UA: NEGATIVE
KETONES UA: NEGATIVE
LEUKOCYTES UA: NEGATIVE
Nitrite, UA: NEGATIVE
PH UA: 8
PROTEIN UA: NEGATIVE
Urobilinogen, UA: NEGATIVE

## 2015-02-10 LAB — LIPID PANEL
CHOL/HDL RATIO: 2.7 ratio (ref <=5.0)
CHOLESTEROL: 195 mg/dL (ref 125–200)
HDL: 71 mg/dL (ref >=46)
LDL Cholesterol: 113 mg/dL (ref <130)
Triglycerides: 56 mg/dL (ref <150)
VLDL: 11 mg/dL (ref <30)

## 2015-02-10 LAB — TSH: TSH: 2.064 u[IU]/mL (ref 0.350–4.500)

## 2015-02-10 LAB — COMPREHENSIVE METABOLIC PANEL
ALK PHOS: 73 U/L (ref 33–130)
ALT: 16 U/L (ref 6–29)
AST: 18 U/L (ref 10–35)
Albumin: 4.3 g/dL (ref 3.6–5.1)
BUN: 18 mg/dL (ref 7–25)
CALCIUM: 9.2 mg/dL (ref 8.6–10.4)
CO2: 26 mmol/L (ref 20–31)
Chloride: 102 mmol/L (ref 98–110)
Creat: 0.87 mg/dL (ref 0.50–1.05)
Glucose, Bld: 88 mg/dL (ref 65–99)
POTASSIUM: 4.2 mmol/L (ref 3.5–5.3)
Sodium: 142 mmol/L (ref 135–146)
TOTAL PROTEIN: 7 g/dL (ref 6.1–8.1)
Total Bilirubin: 0.3 mg/dL (ref 0.2–1.2)

## 2015-02-10 NOTE — Progress Notes (Signed)
Patient ID: Heather Moore, female   DOB: 1956-03-28, 59 y.o.   MRN: [id number] 59 y.o. G0P0 Married  Caucasian Fe here for annual exam.  No new health problems.  She mother passed in June at age 51.  Patient's last menstrual period was 05/16/2005 (exact date).          Sexually active: Yes.    The current method of family planning is tubal ligation and post menopausal status.    Exercising: Yes.    walking at work everyday and walking dog in afternoons Smoker:  no  Health Maintenance: Pap: 11/01/2011, Negative with negative HR HPV  MMG: 10/30/14, Bi-Rads 1: Negative  Colonoscopy: 05/2008 normal repeat in 10 yrs.  BMD: Never  TDaP: 10/15/10  Labs: HB:  13.3   Urine:  Negative    reports that she has never smoked. She has never used smokeless tobacco. She reports that she drinks about 0.6 oz of alcohol per week. She reports that she does not use illicit drugs.  Past Medical History  Diagnosis Date  . Dysmenorrhea   . Irregular menses   . Vulvar abscess 11/05/2012, 07/2014    Culture was + for Staph Aureus    Past Surgical History  Procedure Laterality Date  . Ankle arthodesis w/ arthroscopy Right 04/2010  . Colonoscopy  05/2008  . Tubal ligation  05/16/05  . Endometrial biopsy  01/05/2005    Endo polyp with fragmented endo breakdown  . Cervical polypectomy  05/16/2005    benign  . Breast surgery Left 06/1998    breast biopsy -benign    Current Outpatient Prescriptions  Medication Sig Dispense Refill  . acetaminophen (TYLENOL) 325 MG tablet Take 650 mg by mouth every 6 (six) hours as needed for pain.    Marland Kitchen aspirin 81 MG tablet Take 81 mg by mouth as needed for pain.    . calcium carbonate (TUMS) 500 MG chewable tablet Chew 1 tablet by mouth daily.    . Cholecalciferol (VITAMIN D) 2000 UNITS CAPS Take by mouth daily.    . Fish Oil-Krill Oil CAPS Take by mouth.    . Multiple Vitamins-Minerals (MULTIVITAMIN PO) Take by mouth daily.    . naproxen (NAPROSYN) 500 MG tablet Take 500 mg  by mouth 2 (two) times daily with a meal.    . Simethicone (GAS-X PO) Take by mouth as needed.     No current facility-administered medications for this visit.    Family History  Problem Relation Age of Onset  . Heart failure Mother   . Leukemia Father   . Cancer Mother     ROS:  Pertinent items are noted in HPI.  Otherwise, a comprehensive ROS was negative.  Exam:   BP 114/76 mmHg  Pulse 68  Ht 5\' 3"  (1.6 m)  Wt 178 lb (80.74 kg)  BMI 31.54 kg/m2  LMP 05/16/2005 (Exact Date) Height: 5\' 3"  (160 cm) Ht Readings from Last 3 Encounters:  02/10/15 5\' 3"  (1.6 m)  08/14/14 5\' 3"  (1.6 m)  08/04/14 5\' 3"  (1.6 m)    General appearance: alert, cooperative and appears stated age Head: Normocephalic, without obvious abnormality, atraumatic Neck: no adenopathy, supple, symmetrical, trachea midline and thyroid normal to inspection and palpation Lungs: clear to auscultation bilaterally Breasts: normal appearance, no masses or tenderness Heart: regular rate and rhythm Abdomen: soft, non-tender; no masses,  no organomegaly Extremities: extremities normal, atraumatic, no cyanosis or edema Skin: Skin color, texture, turgor normal. No rashes or lesions Lymph nodes: Cervical,  supraclavicular, and axillary nodes normal. No abnormal inguinal nodes palpated Neurologic: Grossly normal   Pelvic: External genitalia:  no lesions              Urethra:  normal appearing urethra with no masses, tenderness or lesions              Bartholin's and Skene's: normal                 Vagina: very atrophic appearing vagina with pale color and discharge, no lesions              Cervix: anteverted              Pap taken: Yes.   Bimanual Exam:  Uterus:  normal size, contour, position, consistency, mobility, non-tender              Adnexa: no mass, fullness, tenderness               Rectovaginal: Confirms               Anus:  normal sphincter tone, no lesions  Chaperone present: yes  A:  Well Woman with  normal exam  Postmenopausal no HRT  Atrophic vaginitis   P:   Reviewed health and wellness pertinent to exam  Pap smear as above  Mammogram is due 10/2015  Recommend OTC lubrication  Counseled on breast self exam, mammography screening, adequate intake of calcium and vitamin D, diet and exercise, Kegel's exercises return annually or prn  An After Visit Summary was printed and given to the patient.

## 2015-02-10 NOTE — Patient Instructions (Addendum)
EXERCISE AND DIET:  We recommended that you start or continue a regular exercise program for good health. Regular exercise means any activity that makes your heart beat faster and makes you sweat.  We recommend exercising at least 30 minutes per day at least 3 days a week, preferably 4 or 5.  We also recommend a diet low in fat and sugar.  Inactivity, poor dietary choices and obesity can cause diabetes, heart attack, stroke, and kidney damage, among others.    ALCOHOL AND SMOKING:  Women should limit their alcohol intake to no more than 7 drinks/beers/glasses of wine (combined, not each!) per week. Moderation of alcohol intake to this level decreases your risk of breast cancer and liver damage. And of course, no recreational drugs are part of a healthy lifestyle.  And absolutely no smoking or even second hand smoke. Most people know smoking can cause heart and lung diseases, but did you know it also contributes to weakening of your bones? Aging of your skin?  Yellowing of your teeth and nails?  CALCIUM AND VITAMIN D:  Adequate intake of calcium and Vitamin D are recommended.  The recommendations for exact amounts of these supplements seem to change often, but generally speaking 600 mg of calcium (either carbonate or citrate) and 800 units of Vitamin D per day seems prudent. Certain women may benefit from higher intake of Vitamin D.  If you are among these women, your doctor will have told you during your visit.    PAP SMEARS:  Pap smears, to check for cervical cancer or precancers,  have traditionally been done yearly, although recent scientific advances have shown that most women can have pap smears less often.  However, every woman still should have a physical exam from her gynecologist every year. It will include a breast check, inspection of the vulva and vagina to check for abnormal growths or skin changes, a visual exam of the cervix, and then an exam to evaluate the size and shape of the uterus and  ovaries.  And after 57 years of age, a rectal exam is indicated to check for rectal cancers. We will also provide age appropriate advice regarding health maintenance, like when you should have certain vaccines, screening for sexually transmitted diseases, bone density testing, colonoscopy, mammograms, etc.   MAMMOGRAMS:  All women over 40 years old should have a yearly mammogram. Many facilities now offer a "3D" mammogram, which may cost around $50 extra out of pocket. If possible,  we recommend you accept the option to have the 3D mammogram performed.  It both reduces the number of women who will be called back for extra views which then turn out to be normal, and it is better than the routine mammogram at detecting truly abnormal areas.    COLONOSCOPY:  Colonoscopy to screen for colon cancer is recommended for all women at age 50.  We know, you hate the idea of the prep.  We agree, BUT, having colon cancer and not knowing it is worse!!  Colon cancer so often starts as a polyp that can be seen and removed at colonscopy, which can quite literally save your life!  And if your first colonoscopy is normal and you have no family history of colon cancer, most women don't have to have it again for 10 years.  Once every ten years, you can do something that may end up saving your life, right?  We will be happy to help you get it scheduled when you are ready.    Be sure to check your insurance coverage so you understand how much it will cost.  It may be covered as a preventative service at no cost, but you should check your particular policy.     Atrophic Vaginitis Atrophic vaginitis is a problem of low levels of estrogen in women. This problem can happen at any age. It is most common in women who have gone through menopause ("the change").  HOW WILL I KNOW IF I HAVE THIS PROBLEM? You may have:  Trouble with peeing (urinating), such as:  Going to the bathroom often.  A hard time holding your pee until you reach  a bathroom.  Leaking pee.  Having pain when you pee.  Itching or a burning feeling.  Vaginal bleeding and spotting.  Pain during sex.  Dryness of the vagina.  A yellow, bad-smelling fluid (discharge) coming from the vagina. HOW WILL MY DOCTOR CHECK FOR THIS PROBLEM?  During your exam, your doctor will likely find the problem.  If there is a vaginal fluid, it may be checked for infection. HOW WILL THIS PROBLEM BE TREATED? Keep the vulvar skin as clean as possible. Moisturizers and lubricants can help with some of the symptoms. Estrogen replacement can help. There are 2 ways to take estrogen:  Systemic estrogen gets estrogen to your whole body. It takes many weeks or months before the symptoms get better.  You take an estrogen pill.  You use a skin patch. This is a patch that you put on your skin.  If you still have your uterus, your doctor may ask you to take a hormone. Talk to your doctor about the right medicine for you.  Estrogen cream.  This puts estrogen only at the part of your body where you apply it. The cream is put into the vagina or put on the vulvar skin. For some women, estrogen cream works faster than pills or the patch. CAN ALL WOMEN WITH THIS PROBLEM USE ESTROGEN? No. Women with certain types of cancer, liver problems, or problems with blood clots should not take estrogen. Your doctor can help you decide the best treatment for your symptoms. Document Released: 11/02/2007 Document Revised: 05/21/2013 Document Reviewed: 11/02/2007 [employer] Patient Information 2015 [employer]. This information is not intended to replace advice given to you by your health care provider. Make sure you discuss any questions you have with your health care provider.  

## 2015-02-11 LAB — VITAMIN D 25 HYDROXY (VIT D DEFICIENCY, FRACTURES): VIT D 25 HYDROXY: 36 ng/mL (ref 30–100)

## 2015-02-11 LAB — HEMOGLOBIN, FINGERSTICK: HEMOGLOBIN, FINGERSTICK: 13.3 g/dL (ref 12.0–16.0)

## 2015-02-12 LAB — IPS PAP TEST WITH HPV

## 2015-02-21 NOTE — Progress Notes (Signed)
Encounter reviewed by Dr. Brook Amundson C. Silva.  

## 2015-06-25 ENCOUNTER — Encounter: Payer: Self-pay | Admitting: Certified Nurse Midwife

## 2015-06-25 ENCOUNTER — Ambulatory Visit (INDEPENDENT_AMBULATORY_CARE_PROVIDER_SITE_OTHER): Payer: BLUE CROSS/BLUE SHIELD | Admitting: Certified Nurse Midwife

## 2015-06-25 VITALS — BP 104/70 | HR 72 | Resp 16 | Ht 63.0 in | Wt 185.0 lb

## 2015-06-25 DIAGNOSIS — L723 Sebaceous cyst: Secondary | ICD-10-CM | POA: Diagnosis not present

## 2015-06-25 NOTE — Patient Instructions (Signed)
  Epsom salt tub bath twice daily. Call if any increase in size. Do not squeeze.

## 2015-06-25 NOTE — Progress Notes (Signed)
60 yo white married female g0p0 here with complaint of vulva bump she found this am. Denies fever, chills, or exudate from area. History of ingrown hair and sebaceous cyst in vulva area. Has not treated area with anything at present. Denies pain in area or redness. Denies any vaginal symptoms. Does perspire in area frequently at work. No other health concerns today.  O:Healthy female WDWN Affect: normal, orientation x 3  Exam: Skin: warm and dry Lymph node: no enlargement or tenderness Pelvic exam: External genital: normal female, no scaling or exudate noted. Tiny sebaceous on left mid vulva area, pointing noted of tiny cyst.. No firmness at base, non tender, no hair noted with magnification in area.. Pelvic exam not done   A: Tiny sebaceous cyst left vulva History of cyst with I&D  P:Discussed findings of sebaceous with patient and etiology. Shown to patient with mirror. Instructed to start epsom tub bath this pm for at least 15 minutes. Epsom soak in am prior to work and then in the tub once home. Encourage to sleep in loose underwear to lessen compression to area. Avoid squeezing area. Warning signs were reviewed and is she aware.  Instructed to call if increases in size.  RV prn

## 2015-06-28 NOTE — Progress Notes (Signed)
Reviewed personally.  M. Suzanne Daleena Rotter, MD.  

## 2015-11-02 DIAGNOSIS — Z803 Family history of malignant neoplasm of breast: Secondary | ICD-10-CM | POA: Diagnosis not present

## 2015-11-02 DIAGNOSIS — Z1231 Encounter for screening mammogram for malignant neoplasm of breast: Secondary | ICD-10-CM | POA: Diagnosis not present

## 2015-11-18 ENCOUNTER — Encounter: Payer: Self-pay | Admitting: Nurse Practitioner

## 2015-11-18 DIAGNOSIS — J019 Acute sinusitis, unspecified: Secondary | ICD-10-CM | POA: Diagnosis not present

## 2015-11-18 DIAGNOSIS — R05 Cough: Secondary | ICD-10-CM | POA: Diagnosis not present

## 2016-02-15 ENCOUNTER — Ambulatory Visit (INDEPENDENT_AMBULATORY_CARE_PROVIDER_SITE_OTHER): Payer: BLUE CROSS/BLUE SHIELD | Admitting: Nurse Practitioner

## 2016-02-15 ENCOUNTER — Encounter: Payer: Self-pay | Admitting: Nurse Practitioner

## 2016-02-15 VITALS — BP 116/60 | HR 68 | Resp 18 | Ht 63.75 in | Wt 182.0 lb

## 2016-02-15 DIAGNOSIS — Z0000 Encounter for general adult medical examination without abnormal findings: Secondary | ICD-10-CM

## 2016-02-15 DIAGNOSIS — Z01419 Encounter for gynecological examination (general) (routine) without abnormal findings: Secondary | ICD-10-CM | POA: Diagnosis not present

## 2016-02-15 DIAGNOSIS — Z1211 Encounter for screening for malignant neoplasm of colon: Secondary | ICD-10-CM

## 2016-02-15 LAB — COMPREHENSIVE METABOLIC PANEL
ALBUMIN: 4.2 g/dL (ref 3.6–5.1)
ALK PHOS: 66 U/L (ref 33–130)
ALT: 14 U/L (ref 6–29)
AST: 18 U/L (ref 10–35)
BILIRUBIN TOTAL: 0.3 mg/dL (ref 0.2–1.2)
BUN: 19 mg/dL (ref 7–25)
CHLORIDE: 104 mmol/L (ref 98–110)
CO2: 30 mmol/L (ref 20–31)
CREATININE: 0.8 mg/dL (ref 0.50–1.05)
Calcium: 9.7 mg/dL (ref 8.6–10.4)
Glucose, Bld: 89 mg/dL (ref 65–99)
Potassium: 4.5 mmol/L (ref 3.5–5.3)
SODIUM: 142 mmol/L (ref 135–146)
TOTAL PROTEIN: 6.8 g/dL (ref 6.1–8.1)

## 2016-02-15 LAB — POCT URINALYSIS DIPSTICK
BILIRUBIN UA: NEGATIVE
Blood, UA: NEGATIVE
Glucose, UA: NEGATIVE
Ketones, UA: NEGATIVE
LEUKOCYTES UA: NEGATIVE
NITRITE UA: NEGATIVE
PH UA: 8
PROTEIN UA: NEGATIVE
Urobilinogen, UA: NEGATIVE

## 2016-02-15 LAB — LIPID PANEL
Cholesterol: 195 mg/dL (ref 125–200)
HDL: 76 mg/dL (ref >=46)
LDL Cholesterol: 106 mg/dL (ref <130)
TRIGLYCERIDES: 63 mg/dL (ref <150)
Total CHOL/HDL Ratio: 2.6 Ratio (ref <=5.0)
VLDL: 13 mg/dL (ref <30)

## 2016-02-15 LAB — HEMOGLOBIN, FINGERSTICK: HEMOGLOBIN, FINGERSTICK: 12.8 g/dL (ref 12.0–16.0)

## 2016-02-15 LAB — HEPATITIS C ANTIBODY: HCV AB: NEGATIVE

## 2016-02-15 LAB — TSH: TSH: 2.14 mIU/L

## 2016-02-15 MED ORDER — ESCITALOPRAM OXALATE 10 MG PO TABS: 10.0000 mg | ORAL_TABLET | Freq: Every day | ORAL | 0 refills | Status: DC

## 2016-02-15 NOTE — Patient Instructions (Signed)

## 2016-02-15 NOTE — Progress Notes (Signed)
Patient ID: Heather BrooksSharon S Moore, female   DOB: Mar 24, 1956, 60 y.o.   MRN: [id number]  60 y.o. G0P0000 Married  Caucasian Fe here for annual exam.  Husband ae [age] now diagnosed with stage 4 lung cancer.  Already had local radiation and one chemo treatment so far.  Because of SE the next 2 were postponed - going back again tomorrow.   Wt loss of almost 100 lbs.  Patient's last menstrual period was 11/15/2009.          Sexually active: Yes.    The current method of family planning is tubal ligation and post menopausal status.    Exercising: Yes.    walking Smoker:  no  Health Maintenance: Pap: 02/10/15, Negative with negative HR HPV  MMG: 11/02/15, Bi-Rads 1: Negative  Colonoscopy: 05/2008 normal repeat in 10 yrs.  BMD: Never  TDaP: 10/15/10 Hep C and HIV: done Hgb: 12.8  Urine: Normal   reports that she has never smoked. She has never used smokeless tobacco. She reports that she drinks about 0.6 oz of alcohol per week . She reports that she does not use drugs.  Past Medical History:  Diagnosis Date  . Dysmenorrhea   . Irregular menses   . Vulvar abscess 11/05/2012, 07/2014   Culture was + for Staph Aureus    Past Surgical History:  Procedure Laterality Date  . ANKLE ARTHODESIS W/ ARTHROSCOPY Right 04/2010  . BREAST SURGERY Left 06/1998   breast biopsy -benign  . CERVICAL POLYPECTOMY  05/16/2005   benign  . COLONOSCOPY  05/2008  . ENDOMETRIAL BIOPSY  01/05/2005   Endo polyp with fragmented endo breakdown  . TUBAL LIGATION  05/16/05    Current Outpatient Prescriptions  Medication Sig Dispense Refill  . acetaminophen (TYLENOL) 325 MG tablet Take 650 mg by mouth every 6 (six) hours as needed for pain.    Marland Kitchen. aspirin 81 MG tablet Take 81 mg by mouth as needed for pain.    . calcium carbonate (TUMS) 500 MG chewable tablet Chew 1 tablet by mouth as needed.     . Cholecalciferol (VITAMIN D) 2000 UNITS CAPS Take by mouth daily.    . Fish Oil-Krill Oil CAPS Take by mouth.    . Multiple  Vitamins-Minerals (MULTIVITAMIN PO) Take by mouth daily.    . naproxen (NAPROSYN) 500 MG tablet Take 500 mg by mouth as needed.     . Simethicone (GAS-X PO) Take by mouth as needed.     No current facility-administered medications for this visit.     Family History  Problem Relation Age of Onset  . Heart failure Mother   . Leukemia Father   . Cancer Mother     ROS:  Pertinent items are noted in HPI.  Otherwise, a comprehensive ROS was negative.  Exam:   LMP 11/15/2009    Ht Readings from Last 3 Encounters:  06/25/15 5\' 3"  (1.6 m)  02/10/15 5\' 3"  (1.6 m)  08/14/14 5\' 3"  (1.6 m)    General appearance: alert, cooperative and appears stated age, tearful and anxious Head: Normocephalic, without obvious abnormality, atraumatic Neck: no adenopathy, supple, symmetrical, trachea midline and thyroid normal to inspection and palpation Lungs: clear to auscultation bilaterally Breasts: normal appearance, no masses or tenderness Heart: regular rate and rhythm Abdomen: soft, non-tender; no masses,  no organomegaly Extremities: extremities normal, atraumatic, no cyanosis or edema Skin: Skin color, texture, turgor normal. No rashes or lesions Lymph nodes: Cervical, supraclavicular, and axillary nodes normal. No abnormal inguinal nodes  palpated Neurologic: Grossly normal   Pelvic: External genitalia:  no lesions              Urethra:  normal appearing urethra with no masses, tenderness or lesions              Bartholin's and Skene's: normal                 Vagina: normal appearing vagina with normal color and discharge, no lesions              Cervix: anteverted              Pap taken: No. Bimanual Exam:  Uterus:  normal size, contour, position, consistency, mobility, non-tender              Adnexa: no mass, fullness, tenderness               Rectovaginal: Confirms               Anus:  normal sphincter tone, no lesions  Chaperone present: no  A:  Well Woman with normal  exam  Postmenopausal no HRT             Atrophic vaginitis  Family stressors with husbands new diagnosis of lung cancer   P:   Reviewed health and wellness pertinent to exam  Pap smear not done  Mammogram is due 10/2016  She is given Lexapro 10 mg  To take at HS - only 90 is given so that we may check on her progress with medication.  She was not asked per usual protocol to return in 3 months because of her husbands current condition and she is really trying not to miss any work to save up her time for him if needed.  But she will call with any update and when a refill is needed so that we can at least hear how she is doing. counseled on breast self exam, mammography screening, adequate intake of calcium and vitamin D, diet and exercise, Kegel's exercises return annually or prn  An After Visit Summary was printed and given to the patient.

## 2016-02-16 LAB — HIV ANTIBODY (ROUTINE TESTING W REFLEX): HIV 1&2 Ab, 4th Generation: NONREACTIVE

## 2016-02-16 LAB — VITAMIN D 25 HYDROXY (VIT D DEFICIENCY, FRACTURES): VIT D 25 HYDROXY: 40 ng/mL (ref 30–100)

## 2016-02-19 NOTE — Progress Notes (Signed)
Encounter reviewed by Dr. Marleni Gallardo Amundson C. Silva.  

## 2016-03-09 DIAGNOSIS — Z23 Encounter for immunization: Secondary | ICD-10-CM | POA: Diagnosis not present

## 2016-04-29 DIAGNOSIS — E669 Obesity, unspecified: Secondary | ICD-10-CM | POA: Diagnosis not present

## 2016-04-29 DIAGNOSIS — Z0000 Encounter for general adult medical examination without abnormal findings: Secondary | ICD-10-CM | POA: Diagnosis not present

## 2016-04-29 DIAGNOSIS — Z1389 Encounter for screening for other disorder: Secondary | ICD-10-CM | POA: Diagnosis not present

## 2016-04-29 DIAGNOSIS — Z6831 Body mass index (BMI) 31.0-31.9, adult: Secondary | ICD-10-CM | POA: Diagnosis not present

## 2016-05-05 ENCOUNTER — Encounter: Payer: Self-pay | Admitting: *Deleted

## 2016-05-12 ENCOUNTER — Other Ambulatory Visit: Payer: Self-pay | Admitting: Nurse Practitioner

## 2016-05-12 NOTE — Telephone Encounter (Signed)
Medication refill request: Escitalopram Last AEX:  02/15/16 PG Next AEX: 02/15/17 PG Last MMG (if hormonal medication request): 11/02/15 BIRADS1 Density B [facility] Refill authorized: 02/15/16 [id number]. Please advise. Thank you.

## 2016-05-13 NOTE — Telephone Encounter (Signed)
Pt needs a progress report before refills of Lexapro.  She was not asked to come in for a 3 month recheck due to her husbands health status.  But we do need to know how or if medication is helping.

## 2016-05-13 NOTE — Telephone Encounter (Signed)
Left message to call Navya Timmons at [phone]. 

## 2016-05-13 NOTE — Telephone Encounter (Signed)
Refill is given since it is the weekend.  Still needs to call with a progress report.

## 2016-05-16 NOTE — Telephone Encounter (Signed)
Left message for patient to call Summer back.  

## 2016-05-19 NOTE — Telephone Encounter (Signed)
I have left 2 messages for patient to call back. Heather AlanisKaitlyn has also left a message.

## 2016-05-19 NOTE — Telephone Encounter (Signed)
Left message on vm (per DPR) to advise her to call back.

## 2016-06-30 ENCOUNTER — Other Ambulatory Visit: Payer: Self-pay | Admitting: Nurse Practitioner

## 2016-06-30 NOTE — Telephone Encounter (Signed)
Medication refill request: lexapro  Last AEX:  02/15/16 PG Next AEX: 02/15/17 PG  Last MMG (if hormonal medication request): 11/02/15 BIRADS1:Neg  Refill authorized: 05/13/16 #[id number]. Today please advise.

## 2016-07-12 DIAGNOSIS — M25531 Pain in right wrist: Secondary | ICD-10-CM | POA: Diagnosis not present

## 2016-07-12 DIAGNOSIS — J111 Influenza due to unidentified influenza virus with other respiratory manifestations: Secondary | ICD-10-CM | POA: Diagnosis not present

## 2016-11-06 DIAGNOSIS — J0140 Acute pansinusitis, unspecified: Secondary | ICD-10-CM | POA: Diagnosis not present

## 2016-11-06 DIAGNOSIS — R05 Cough: Secondary | ICD-10-CM | POA: Diagnosis not present

## 2016-11-15 DIAGNOSIS — R05 Cough: Secondary | ICD-10-CM | POA: Diagnosis not present

## 2016-11-15 DIAGNOSIS — J018 Other acute sinusitis: Secondary | ICD-10-CM | POA: Diagnosis not present

## 2017-01-24 DIAGNOSIS — Z1231 Encounter for screening mammogram for malignant neoplasm of breast: Secondary | ICD-10-CM | POA: Diagnosis not present

## 2017-01-24 DIAGNOSIS — Z803 Family history of malignant neoplasm of breast: Secondary | ICD-10-CM | POA: Diagnosis not present

## 2017-02-15 ENCOUNTER — Ambulatory Visit: Payer: BLUE CROSS/BLUE SHIELD | Admitting: Nurse Practitioner

## 2017-02-15 ENCOUNTER — Ambulatory Visit: Payer: BLUE CROSS/BLUE SHIELD | Admitting: Certified Nurse Midwife

## 2017-02-27 ENCOUNTER — Encounter: Payer: Self-pay | Admitting: Certified Nurse Midwife

## 2017-03-08 DIAGNOSIS — Z23 Encounter for immunization: Secondary | ICD-10-CM | POA: Diagnosis not present

## 2017-03-16 ENCOUNTER — Ambulatory Visit (INDEPENDENT_AMBULATORY_CARE_PROVIDER_SITE_OTHER): Payer: BLUE CROSS/BLUE SHIELD | Admitting: Certified Nurse Midwife

## 2017-03-16 ENCOUNTER — Encounter: Payer: Self-pay | Admitting: Certified Nurse Midwife

## 2017-03-16 VITALS — BP 104/62 | HR 76 | Resp 16 | Ht 63.5 in | Wt 180.0 lb

## 2017-03-16 DIAGNOSIS — F5105 Insomnia due to other mental disorder: Secondary | ICD-10-CM

## 2017-03-16 DIAGNOSIS — N951 Menopausal and female climacteric states: Secondary | ICD-10-CM

## 2017-03-16 DIAGNOSIS — Z0000 Encounter for general adult medical examination without abnormal findings: Secondary | ICD-10-CM | POA: Diagnosis not present

## 2017-03-16 DIAGNOSIS — Z01419 Encounter for gynecological examination (general) (routine) without abnormal findings: Secondary | ICD-10-CM | POA: Diagnosis not present

## 2017-03-16 DIAGNOSIS — F418 Other specified anxiety disorders: Secondary | ICD-10-CM

## 2017-03-16 DIAGNOSIS — E663 Overweight: Secondary | ICD-10-CM

## 2017-03-16 MED ORDER — ESCITALOPRAM OXALATE 10 MG PO TABS: 10.0000 mg | ORAL_TABLET | Freq: Every day | ORAL | 1 refills | Status: DC

## 2017-03-16 NOTE — Progress Notes (Signed)
61 y.o. G0P0000 Married  Caucasian Fe here for annual exam. Menopausal no HRT. Denies vaginal bleeding or vaginal dryness. Still occasional hot flash or night sweat. No insomnia issues. Sees PCP Dr. Jean Rosenthal as needed. Labs as needed. Lexapro working well for insomnia due to her anxiety/depression with spouse having cancer. Spouse stable at present. Emotionally doing well. No other health issues today. Request screening labs.  Patient's last menstrual period was 11/15/2009.          Sexually active: No.  The current method of family planning is tubal ligation.    Exercising: Yes.    walking Smoker:  no  Health Maintenance: Pap:  02-10-15 neg HPV HR neg History of Abnormal Pap: no MMG:  01-24-17 category b density birads 1:neg Self Breast exams: yes Colonoscopy:  2010 f/u 51yrs BMD:   none TDaP:  2012 Shingles: no Pneumonia: no Hep C and HIV: both neg 2017 Labs: discuss today   reports that she has never smoked. She has never used smokeless tobacco. She reports that she drinks about 0.6 oz of alcohol per week . She reports that she does not use drugs.  Past Medical History:  Diagnosis Date  . Dysmenorrhea   . Irregular menses   . Vulvar abscess 11/05/2012, 07/2014   Culture was + for Staph Aureus    Past Surgical History:  Procedure Laterality Date  . ANKLE ARTHODESIS W/ ARTHROSCOPY Right 04/2010  . BREAST SURGERY Left 06/1998   breast biopsy -benign  . CERVICAL POLYPECTOMY  05/16/2005   benign  . COLONOSCOPY  05/2008  . ENDOMETRIAL BIOPSY  01/05/2005   Endo polyp with fragmented endo breakdown  . TUBAL LIGATION  05/16/05    Current Outpatient Prescriptions  Medication Sig Dispense Refill  . acetaminophen (TYLENOL) 325 MG tablet Take 650 mg by mouth every 6 (six) hours as needed for pain.    Marland Kitchen aspirin 81 MG tablet Take 81 mg by mouth as needed for pain.    . calcium carbonate (TUMS) 500 MG chewable tablet Chew 1 tablet by mouth as needed.     . Cholecalciferol (VITAMIN D) 2000  UNITS CAPS Take by mouth daily.    Marland Kitchen escitalopram (LEXAPRO) 10 MG tablet TAKE 1 TABLET DAILY 90 tablet 2  . FIBER ADULT GUMMIES PO Take by mouth daily.    . Fish Oil-Krill Oil CAPS Take by mouth.    . Multiple Vitamins-Minerals (MULTIVITAMIN PO) Take by mouth daily.    . naproxen (NAPROSYN) 500 MG tablet Take 500 mg by mouth as needed.     . Probiotic Product (PROBIOTIC PO) Take by mouth daily.    . Simethicone (GAS-X PO) Take by mouth as needed.     No current facility-administered medications for this visit.     Family History  Problem Relation Age of Onset  . Heart failure Mother   . Cancer Mother 31       unknown  . Heart disease Mother 24       heart valve replaced  - hx of heart valve problem most of life  . Leukemia Father     ROS:  Pertinent items are noted in HPI.  Otherwise, a comprehensive ROS was negative.  Exam:   BP 104/62 (BP Location: Right Arm, Patient Position: Sitting, Cuff Size: Large)   Pulse 76   Resp 16   Ht 5' 3.5" (1.613 m)   Wt 180 lb (81.6 kg)   LMP 11/15/2009   BMI 31.39 kg/m  Height: 5'  3.5" (161.3 cm) Ht Readings from Last 3 Encounters:  03/16/17 5' 3.5" (1.613 m)  02/15/16 5' 3.75" (1.619 m)  06/25/15 5\' 3"  (1.6 m)    General appearance: alert, cooperative and appears stated age Head: Normocephalic, without obvious abnormality, atraumatic Neck: no adenopathy, supple, symmetrical, trachea midline and thyroid normal to inspection and palpation Lungs: clear to auscultation bilaterally Breasts: normal appearance, no masses or tenderness, No nipple retraction or dimpling, No nipple discharge or bleeding, No axillary or supraclavicular adenopathy Heart: regular rate and rhythm Abdomen: soft, non-tender; no masses,  no organomegaly Extremities: extremities normal, atraumatic, no cyanosis or edema Skin: Skin color, texture, turgor normal. No rashes or lesions Lymph nodes: Cervical, supraclavicular, and axillary nodes normal. No abnormal inguinal  nodes palpated Neurologic: Grossly normal   Pelvic: External genitalia:  no lesions              Urethra:  normal appearing urethra with no masses, tenderness or lesions              Bartholin's and Skene's: normal                 Vagina: normal appearing vagina with normal color and discharge, no lesions              Cervix: no cervical motion tenderness, no lesions and normal appearance              Pap taken: No. Bimanual Exam:  Uterus:  mid position              Adnexa: normal adnexa and no mass, fullness, tenderness               Rectovaginal: Confirms               Anus:  normal sphincter one, no lesions  Chaperone present: yes  A:  Well Woman with normal exam  Menopausal no HRT, Lexapro working well for symptoms and Insomnia  Anxiety/depression Lexapro working well  Screening labs  P:   Reviewed health and wellness pertinent to exam  Aware of need to advise if vaginal bleeding  Discussed risks/benefits of Lexapro use. Patient feels this really helps when needed. Does not take daily. Discussed if starts on daily basis, needs to wean off, not stop suddenly.  Labs: CMP,Lipid panel, Vit. D, TSH  Rx Lexapro see order with instructions  Pap smear: no   counseled on breast self exam, mammography screening, adequate intake of calcium and vitamin D, diet and exercise, Kegel's exercises  return annually or prn  An After Visit Summary was printed and given to the patient.

## 2017-03-16 NOTE — Patient Instructions (Signed)

## 2017-03-17 LAB — COMPREHENSIVE METABOLIC PANEL
A/G RATIO: 1.6 (ref 1.2–2.2)
ALBUMIN: 4.4 g/dL (ref 3.5–5.5)
ALT: 16 IU/L (ref 0–32)
AST: 19 IU/L (ref 0–40)
Alkaline Phosphatase: 74 IU/L (ref 39–117)
BUN / CREAT RATIO: 17 (ref 9–23)
BUN: 15 mg/dL (ref 6–24)
Bilirubin Total: 0.2 mg/dL (ref 0.0–1.2)
CALCIUM: 9.2 mg/dL (ref 8.7–10.2)
CO2: 26 mmol/L (ref 20–29)
CREATININE: 0.9 mg/dL (ref 0.57–1.00)
Chloride: 104 mmol/L (ref 96–106)
GFR, EST AFRICAN AMERICAN: 82 mL/min/{1.73_m2} (ref >59)
GFR, EST NON AFRICAN AMERICAN: 71 mL/min/{1.73_m2} (ref >59)
GLOBULIN, TOTAL: 2.7 g/dL (ref 1.5–4.5)
Glucose: 86 mg/dL (ref 65–99)
POTASSIUM: 4.1 mmol/L (ref 3.5–5.2)
SODIUM: 144 mmol/L (ref 134–144)
TOTAL PROTEIN: 7.1 g/dL (ref 6.0–8.5)

## 2017-03-17 LAB — LIPID PANEL
CHOL/HDL RATIO: 2.8 ratio (ref 0.0–4.4)
CHOLESTEROL TOTAL: 190 mg/dL (ref 100–199)
HDL: 68 mg/dL (ref >39)
LDL Calculated: 113 mg/dL — ABNORMAL HIGH (ref 0–99)
TRIGLYCERIDES: 43 mg/dL (ref 0–149)
VLDL Cholesterol Cal: 9 mg/dL (ref 5–40)

## 2017-03-17 LAB — TSH: TSH: 1.53 u[IU]/mL (ref 0.450–4.500)

## 2017-03-17 LAB — VITAMIN D 25 HYDROXY (VIT D DEFICIENCY, FRACTURES): Vit D, 25-Hydroxy: 45 ng/mL (ref 30.0–100.0)

## 2017-05-04 DIAGNOSIS — E6609 Other obesity due to excess calories: Secondary | ICD-10-CM | POA: Diagnosis not present

## 2017-05-04 DIAGNOSIS — Z6831 Body mass index (BMI) 31.0-31.9, adult: Secondary | ICD-10-CM | POA: Diagnosis not present

## 2017-05-04 DIAGNOSIS — Z1389 Encounter for screening for other disorder: Secondary | ICD-10-CM | POA: Diagnosis not present

## 2017-05-04 DIAGNOSIS — R7309 Other abnormal glucose: Secondary | ICD-10-CM | POA: Diagnosis not present

## 2017-05-04 DIAGNOSIS — Z0000 Encounter for general adult medical examination without abnormal findings: Secondary | ICD-10-CM | POA: Diagnosis not present

## 2017-07-04 DIAGNOSIS — E6609 Other obesity due to excess calories: Secondary | ICD-10-CM | POA: Diagnosis not present

## 2017-07-04 DIAGNOSIS — F329 Major depressive disorder, single episode, unspecified: Secondary | ICD-10-CM | POA: Diagnosis not present

## 2017-07-04 DIAGNOSIS — Z6831 Body mass index (BMI) 31.0-31.9, adult: Secondary | ICD-10-CM | POA: Diagnosis not present

## 2017-07-04 DIAGNOSIS — Z1389 Encounter for screening for other disorder: Secondary | ICD-10-CM | POA: Diagnosis not present

## 2017-07-31 DIAGNOSIS — E6609 Other obesity due to excess calories: Secondary | ICD-10-CM | POA: Diagnosis not present

## 2017-07-31 DIAGNOSIS — Z6832 Body mass index (BMI) 32.0-32.9, adult: Secondary | ICD-10-CM | POA: Diagnosis not present

## 2017-07-31 DIAGNOSIS — F329 Major depressive disorder, single episode, unspecified: Secondary | ICD-10-CM | POA: Diagnosis not present

## 2017-07-31 DIAGNOSIS — Z1389 Encounter for screening for other disorder: Secondary | ICD-10-CM | POA: Diagnosis not present

## 2017-09-01 DIAGNOSIS — E6609 Other obesity due to excess calories: Secondary | ICD-10-CM | POA: Diagnosis not present

## 2017-09-01 DIAGNOSIS — F320 Major depressive disorder, single episode, mild: Secondary | ICD-10-CM | POA: Diagnosis not present

## 2017-09-01 DIAGNOSIS — Z1389 Encounter for screening for other disorder: Secondary | ICD-10-CM | POA: Diagnosis not present

## 2017-09-01 DIAGNOSIS — Z6832 Body mass index (BMI) 32.0-32.9, adult: Secondary | ICD-10-CM | POA: Diagnosis not present

## 2017-11-15 ENCOUNTER — Other Ambulatory Visit: Payer: Self-pay | Admitting: Certified Nurse Midwife

## 2017-11-15 DIAGNOSIS — F418 Other specified anxiety disorders: Secondary | ICD-10-CM

## 2017-11-15 DIAGNOSIS — F5105 Insomnia due to other mental disorder: Secondary | ICD-10-CM

## 2017-11-16 NOTE — Telephone Encounter (Signed)
Medication refill request: Lexipro 10 mg  Last AEX:  03/06/17 Next AEX: 03/23/18 Last MMG Normal Bi-rads category 1 Neg  Refill authorized:Last given 03/16/17 90 day supply with one refill. Please approve if appropriate.

## 2018-01-31 DIAGNOSIS — Z1231 Encounter for screening mammogram for malignant neoplasm of breast: Secondary | ICD-10-CM | POA: Diagnosis not present

## 2018-02-13 ENCOUNTER — Encounter: Payer: Self-pay | Admitting: Certified Nurse Midwife

## 2018-02-27 DIAGNOSIS — Z23 Encounter for immunization: Secondary | ICD-10-CM | POA: Diagnosis not present

## 2018-03-23 ENCOUNTER — Ambulatory Visit (INDEPENDENT_AMBULATORY_CARE_PROVIDER_SITE_OTHER): Payer: BLUE CROSS/BLUE SHIELD | Admitting: Certified Nurse Midwife

## 2018-03-23 ENCOUNTER — Encounter: Payer: Self-pay | Admitting: Certified Nurse Midwife

## 2018-03-23 ENCOUNTER — Other Ambulatory Visit (HOSPITAL_COMMUNITY)
Admission: RE | Admit: 2018-03-23 | Discharge: 2018-03-23 | Disposition: A | Discharge status: Home / Self Care | Payer: BLUE CROSS/BLUE SHIELD | Source: Ambulatory Visit | Attending: Certified Nurse Midwife | Admitting: Certified Nurse Midwife

## 2018-03-23 ENCOUNTER — Other Ambulatory Visit: Payer: Self-pay

## 2018-03-23 VITALS — BP 118/74 | HR 70 | Resp 16 | Ht 62.75 in | Wt 187.0 lb

## 2018-03-23 DIAGNOSIS — Z01419 Encounter for gynecological examination (general) (routine) without abnormal findings: Secondary | ICD-10-CM

## 2018-03-23 DIAGNOSIS — Z124 Encounter for screening for malignant neoplasm of cervix: Secondary | ICD-10-CM | POA: Insufficient documentation

## 2018-03-23 DIAGNOSIS — Z0000 Encounter for general adult medical examination without abnormal findings: Secondary | ICD-10-CM | POA: Diagnosis not present

## 2018-03-23 DIAGNOSIS — E559 Vitamin D deficiency, unspecified: Secondary | ICD-10-CM

## 2018-03-23 DIAGNOSIS — R635 Abnormal weight gain: Secondary | ICD-10-CM | POA: Diagnosis not present

## 2018-03-23 DIAGNOSIS — F5105 Insomnia due to other mental disorder: Secondary | ICD-10-CM

## 2018-03-23 DIAGNOSIS — F418 Other specified anxiety disorders: Secondary | ICD-10-CM

## 2018-03-23 MED ORDER — ESCITALOPRAM OXALATE 10 MG PO TABS: 10.0000 mg | ORAL_TABLET | Freq: Every day | ORAL | 3 refills | Status: DC

## 2018-03-23 NOTE — Patient Instructions (Signed)

## 2018-03-23 NOTE — Progress Notes (Signed)
62 y.o. G0P0000 Widowed Caucasian Fe here for annual exam. Menopausal no vaginal bleeding or vaginal dryness. Occasional hot flash and night sweats. Still grieving from spouse's death in 06/23/2022. Has good family support and still grieving. "taking one day at a time". Still working in grounds maintenance. Lexapro working well for anxiety and sleep issues, desires continuance. Screening labs today. Plans colonoscopy next year(10 yr follow up). No other health concerns today.  Patient's last menstrual period was 11/15/2009.          Sexually active: No.  The current method of family planning is tubal ligation.    Exercising: Yes.    walking Smoker:  no  Review of Systems  Constitutional:       Weight gain  HENT:       Headache, sinusitis  Eyes: Negative.   Respiratory: Negative.   Cardiovascular: Negative.   Gastrointestinal: Negative.   Genitourinary: Negative.   Musculoskeletal: Negative.   Skin: Negative.   Neurological: Negative.   Endo/Heme/Allergies: Negative.   Psychiatric/Behavioral: Negative.     Health Maintenance: Pap:  02-10-15 neg HPV HR neg History of Abnormal Pap: no MMG:  01-31-18 category b density birads 1:neg Self Breast exams: yes Colonoscopy:  2010 f/u 43yrs BMD:   None, plans next year TDaP:  2012 Shingles: no Pneumonia: no Hep C and HIV: both neg 2017 Labs: yes   reports that she has never smoked. She has never used smokeless tobacco. She reports that she drinks about 1.0 standard drinks of alcohol per week. She reports that she does not use drugs.  Past Medical History:  Diagnosis Date  . Dysmenorrhea   . Irregular menses   . Vulvar abscess 11/05/2012, 07/2014   Culture was + for Staph Aureus    Past Surgical History:  Procedure Laterality Date  . ANKLE ARTHODESIS W/ ARTHROSCOPY Right 04/2010  . BREAST SURGERY Left 06/1998   breast biopsy -benign  . CERVICAL POLYPECTOMY  05/16/2005   benign  . COLONOSCOPY  05/2008  . ENDOMETRIAL BIOPSY  01/05/2005   Endo polyp with fragmented endo breakdown  . TUBAL LIGATION  05/16/05    Current Outpatient Medications  Medication Sig Dispense Refill  . acetaminophen (TYLENOL) 325 MG tablet Take 650 mg by mouth every 6 (six) hours as needed for pain.    Marland Kitchen aspirin 81 MG tablet Take 81 mg by mouth as needed for pain.    . calcium carbonate (TUMS) 500 MG chewable tablet Chew 1 tablet by mouth as needed.     . Cholecalciferol (VITAMIN D) 2000 UNITS CAPS Take by mouth daily.    Marland Kitchen escitalopram (LEXAPRO) 10 MG tablet TAKE 1 TABLET BY MOUTH EVERY DAY 90 tablet 1  . FIBER ADULT GUMMIES PO Take by mouth daily.    . Fish Oil-Krill Oil CAPS Take by mouth.    . Multiple Vitamins-Minerals (MULTIVITAMIN PO) Take by mouth daily.    . naproxen (NAPROSYN) 500 MG tablet Take 500 mg by mouth as needed.     . Probiotic Product (PROBIOTIC PO) Take by mouth daily.    . Simethicone (GAS-X PO) Take by mouth as needed.     No current facility-administered medications for this visit.     Family History  Problem Relation Age of Onset  . Heart failure Mother   . Cancer Mother 20       unknown  . Heart disease Mother 30       heart valve replaced  - hx of heart valve problem  most of life  . Leukemia Father     ROS:  Pertinent items are noted in HPI.  Otherwise, a comprehensive ROS was negative.  Exam:   BP 118/74   Pulse 70   Resp 16   Ht 5' 2.75" (1.594 m)   Wt 187 lb (84.8 kg)   LMP 11/15/2009   BMI 33.39 kg/m  Height: 5' 2.75" (159.4 cm) Ht Readings from Last 3 Encounters:  03/23/18 5' 2.75" (1.594 m)  03/16/17 5' 3.5" (1.613 m)  02/15/16 5' 3.75" (1.619 m)    General appearance: alert, cooperative and appears stated age Head: Normocephalic, without obvious abnormality, atraumatic Neck: no adenopathy, supple, symmetrical, trachea midline and thyroid normal to inspection and palpation Lungs: clear to auscultation bilaterally Breasts: normal appearance, no masses or tenderness, No nipple retraction or  dimpling, No nipple discharge or bleeding, No axillary or supraclavicular adenopathy Heart: regular rate and rhythm Abdomen: soft, non-tender; no masses,  no organomegaly Extremities: extremities normal, atraumatic, no cyanosis or edema Skin: Skin color, texture, turgor normal. No rashes or lesions Lymph nodes: Cervical, supraclavicular, and axillary nodes normal. No abnormal inguinal nodes palpated Neurologic: Grossly normal   Pelvic: External genitalia:  no lesions              Urethra:  normal appearing urethra with no masses, tenderness or lesions              Bartholin's and Skene's: normal                 Vagina: normal appearing vagina with normal color and discharge, no lesions              Cervix: no cervical motion tenderness, no lesions and normal appearance              Pap taken: Yes.   Bimanual Exam:  Uterus:  normal size, contour, position, consistency, mobility, non-tender and anteflexed              Adnexa: normal adnexa and no mass, fullness, tenderness               Rectovaginal: Confirms               Anus:  normal sphincter tone, no lesions  Chaperone present: yes  A:  Well Woman with normal exam  Menopausal no HRT  Anxiety with sleep issues, Lexapro working well.  Social stress with death of spouse  Screening labs  P:   Reviewed health and wellness pertinent to exam  Aware of need to advise if vaginal bleeding  Risks/benefits and warning signs of Lexapro discussed.  Rx Lexapro see order with instructions  Encouraged to seek family and friend support and Hospice Grief support.  Labs: CMP,Lipid panel, Vitamin D TSH, CBC  Pap smear: yes   counseled on breast self exam, mammography screening, feminine hygiene, adequate intake of calcium and vitamin D, diet and exercise, Kegel's exercises  return annually or prn  An After Visit Summary was printed and given to the patient.

## 2018-03-24 LAB — CBC
HEMOGLOBIN: 13.3 g/dL (ref 11.1–15.9)
Hematocrit: 40.4 % (ref 34.0–46.6)
MCH: 31.9 pg (ref 26.6–33.0)
MCHC: 32.9 g/dL (ref 31.5–35.7)
MCV: 97 fL (ref 79–97)
PLATELETS: 345 10*3/uL (ref 150–450)
RBC: 4.17 x10E6/uL (ref 3.77–5.28)
RDW: 12.8 % (ref 12.3–15.4)
WBC: 5.6 10*3/uL (ref 3.4–10.8)

## 2018-03-24 LAB — COMPREHENSIVE METABOLIC PANEL
A/G RATIO: 1.8 (ref 1.2–2.2)
ALK PHOS: 67 IU/L (ref 39–117)
ALT: 18 IU/L (ref 0–32)
AST: 21 IU/L (ref 0–40)
Albumin: 4.3 g/dL (ref 3.5–5.5)
BUN/Creatinine Ratio: 20 (ref 9–23)
BUN: 16 mg/dL (ref 6–24)
Bilirubin Total: 0.3 mg/dL (ref 0.0–1.2)
CALCIUM: 9.2 mg/dL (ref 8.7–10.2)
CO2: 25 mmol/L (ref 20–29)
Chloride: 104 mmol/L (ref 96–106)
Creatinine, Ser: 0.8 mg/dL (ref 0.57–1.00)
GFR calc Af Amer: 93 mL/min/{1.73_m2} (ref >59)
GFR calc non Af Amer: 81 mL/min/{1.73_m2} (ref >59)
GLOBULIN, TOTAL: 2.4 g/dL (ref 1.5–4.5)
Glucose: 77 mg/dL (ref 65–99)
POTASSIUM: 4.3 mmol/L (ref 3.5–5.2)
SODIUM: 144 mmol/L (ref 134–144)
Total Protein: 6.7 g/dL (ref 6.0–8.5)

## 2018-03-24 LAB — TSH: TSH: 1.56 u[IU]/mL (ref 0.450–4.500)

## 2018-03-24 LAB — LIPID PANEL
CHOL/HDL RATIO: 3 ratio (ref 0.0–4.4)
Cholesterol, Total: 205 mg/dL — ABNORMAL HIGH (ref 100–199)
HDL: 68 mg/dL (ref >39)
LDL CALC: 123 mg/dL — AB (ref 0–99)
Triglycerides: 70 mg/dL (ref 0–149)
VLDL CHOLESTEROL CAL: 14 mg/dL (ref 5–40)

## 2018-03-24 LAB — VITAMIN D 25 HYDROXY (VIT D DEFICIENCY, FRACTURES): Vit D, 25-Hydroxy: 39.3 ng/mL (ref 30.0–100.0)

## 2018-03-27 LAB — CYTOLOGY - PAP
DIAGNOSIS: NEGATIVE
HPV (WINDOPATH): NOT DETECTED

## 2018-05-09 DIAGNOSIS — Z0000 Encounter for general adult medical examination without abnormal findings: Secondary | ICD-10-CM | POA: Diagnosis not present

## 2018-05-09 DIAGNOSIS — F329 Major depressive disorder, single episode, unspecified: Secondary | ICD-10-CM | POA: Diagnosis not present

## 2018-05-09 DIAGNOSIS — Z6832 Body mass index (BMI) 32.0-32.9, adult: Secondary | ICD-10-CM | POA: Diagnosis not present

## 2018-05-09 DIAGNOSIS — E6609 Other obesity due to excess calories: Secondary | ICD-10-CM | POA: Diagnosis not present

## 2018-05-09 DIAGNOSIS — Z1389 Encounter for screening for other disorder: Secondary | ICD-10-CM | POA: Diagnosis not present

## 2018-05-15 ENCOUNTER — Encounter: Payer: Self-pay | Admitting: Internal Medicine

## 2018-07-23 DIAGNOSIS — M205X1 Other deformities of toe(s) (acquired), right foot: Secondary | ICD-10-CM | POA: Diagnosis not present

## 2018-07-23 DIAGNOSIS — M79671 Pain in right foot: Secondary | ICD-10-CM | POA: Diagnosis not present

## 2018-07-23 DIAGNOSIS — M2141 Flat foot [pes planus] (acquired), right foot: Secondary | ICD-10-CM | POA: Diagnosis not present

## 2018-08-20 DIAGNOSIS — M79671 Pain in right foot: Secondary | ICD-10-CM | POA: Diagnosis not present

## 2018-08-20 DIAGNOSIS — M205X1 Other deformities of toe(s) (acquired), right foot: Secondary | ICD-10-CM | POA: Diagnosis not present

## 2018-08-20 DIAGNOSIS — M2141 Flat foot [pes planus] (acquired), right foot: Secondary | ICD-10-CM | POA: Diagnosis not present

## 2019-02-06 ENCOUNTER — Encounter: Payer: Self-pay | Admitting: Certified Nurse Midwife

## 2019-02-06 DIAGNOSIS — Z1231 Encounter for screening mammogram for malignant neoplasm of breast: Secondary | ICD-10-CM | POA: Diagnosis not present

## 2019-02-06 DIAGNOSIS — Z803 Family history of malignant neoplasm of breast: Secondary | ICD-10-CM | POA: Diagnosis not present

## 2019-02-14 DIAGNOSIS — Z6834 Body mass index (BMI) 34.0-34.9, adult: Secondary | ICD-10-CM | POA: Diagnosis not present

## 2019-02-14 DIAGNOSIS — F325 Major depressive disorder, single episode, in full remission: Secondary | ICD-10-CM | POA: Diagnosis not present

## 2019-02-14 DIAGNOSIS — E6609 Other obesity due to excess calories: Secondary | ICD-10-CM | POA: Diagnosis not present

## 2019-02-14 DIAGNOSIS — H6121 Impacted cerumen, right ear: Secondary | ICD-10-CM | POA: Diagnosis not present

## 2019-03-27 ENCOUNTER — Other Ambulatory Visit: Payer: Self-pay

## 2019-03-27 NOTE — Progress Notes (Addendum)
63 y.o. G0P0000 Widowed  Caucasian Fe here for annual exam. Post menopausal no vaginal bleeding. Some vaginal dryness. Sees Terie Purser PA, for ear issues, has aex scheduled in 04/2019. Labs here today. Has had some weight gain, but walking daily now. Lexapro working well for anxiety. Denies any warning signs with use. Desires continuance. Mammogram was normal. No health issues today.  Patient's last menstrual period was 11/15/2009.          Sexually active: No.  The current method of family planning is tubal ligation.    Exercising: Yes.    walking Smoker:  no  Review of Systems  Constitutional: Negative.   HENT: Negative.   Eyes: Negative.   Respiratory: Negative.   Cardiovascular: Negative.   Gastrointestinal: Negative.   Genitourinary: Negative.   Musculoskeletal: Negative.   Skin: Negative.   Neurological: Negative.   Endo/Heme/Allergies: Negative.   Psychiatric/Behavioral: Negative.     Health Maintenance: Pap:  02-10-15 neg HPV HR neg, 03-23-18 neg HPV HR neg History of Abnormal Pap: no MMG:  02-06-2019 category b density birads 1:neg Self Breast exams: yes Colonoscopy:  2010 f/u 25yrs BMD:   none TDaP:  2012 Shingles: no Pneumonia: no Hep C and HIV: both neg 2017 Labs: yes   reports that she has never smoked. She has never used smokeless tobacco. She reports current alcohol use. She reports that she does not use drugs.  Past Medical History:  Diagnosis Date  . Dysmenorrhea   . Irregular menses   . Vulvar abscess 11/05/2012, 07/2014   Culture was + for Staph Aureus    Past Surgical History:  Procedure Laterality Date  . ANKLE ARTHODESIS W/ ARTHROSCOPY Right 04/2010  . BREAST SURGERY Left 06/1998   breast biopsy -benign  . CERVICAL POLYPECTOMY  05/16/2005   benign  . COLONOSCOPY  05/2008  . ENDOMETRIAL BIOPSY  01/05/2005   Endo polyp with fragmented endo breakdown  . TUBAL LIGATION  05/16/05    Current Outpatient Medications  Medication Sig Dispense Refill   . acetaminophen (TYLENOL) 325 MG tablet Take 650 mg by mouth every 6 (six) hours as needed for pain.    Marland Kitchen aspirin 81 MG tablet Take 81 mg by mouth as needed for pain.    . calcium carbonate (TUMS) 500 MG chewable tablet Chew 1 tablet by mouth as needed.     . Cholecalciferol (VITAMIN D) 2000 UNITS CAPS Take by mouth daily.    Marland Kitchen escitalopram (LEXAPRO) 10 MG tablet Take 1 tablet (10 mg total) by mouth daily. 90 tablet 3  . FIBER ADULT GUMMIES PO Take by mouth daily.    . Fish Oil-Krill Oil CAPS Take by mouth.    . Multiple Vitamins-Minerals (MULTIVITAMIN PO) Take by mouth daily.    . naproxen (NAPROSYN) 500 MG tablet Take 500 mg by mouth as needed.     . Probiotic Product (PROBIOTIC PO) Take by mouth daily.    . Simethicone (GAS-X PO) Take by mouth as needed.     No current facility-administered medications for this visit.     Family History  Problem Relation Age of Onset  . Heart failure Mother   . Cancer Mother 59       unknown  . Heart disease Mother 92       heart valve replaced  - hx of heart valve problem most of life  . Leukemia Father     ROS:  Pertinent items are noted in HPI.  Otherwise, a comprehensive ROS was  negative.  Exam:   BP 118/78   Pulse 68   Temp (!) 97.2 F (36.2 C) (Skin)   Resp 16   Ht 5' 2.75" (1.594 m)   Wt 200 lb (90.7 kg)   LMP 11/15/2009   BMI 35.71 kg/m  Height: 5' 2.75" (159.4 cm) Ht Readings from Last 3 Encounters:  03/29/19 5' 2.75" (1.594 m)  03/23/18 5' 2.75" (1.594 m)  03/16/17 5' 3.5" (1.613 m)    General appearance: alert, cooperative and appears stated age Head: Normocephalic, without obvious abnormality, atraumatic Neck: no adenopathy, supple, symmetrical, trachea midline and thyroid normal to inspection and palpation Lungs: clear to auscultation bilaterally Breasts: normal appearance, no masses or tenderness, No nipple retraction or dimpling, No nipple discharge or bleeding, No axillary or supraclavicular adenopathy Heart:  regular rate and rhythm Abdomen: soft, non-tender; no masses,  no organomegaly Extremities: extremities normal, atraumatic, no cyanosis or edema Skin: Skin color, texture, turgor normal. No rashes or lesions Lymph nodes: Cervical, supraclavicular, and axillary nodes normal. No abnormal inguinal nodes palpated Neurologic: Grossly normal   Pelvic: External genitalia:  no lesions              Urethra:  normal appearing urethra with no masses, tenderness or lesions              Bartholin's and Skene's: normal                 Vagina: normal appearing vagina with normal color and discharge, no lesions              Cervix: no cervical motion tenderness, no lesions and normal appearance              Pap taken: No. Bimanual Exam:  Uterus:  normal size, contour, position, consistency, mobility, non-tender              Adnexa: normal adnexa and no mass, fullness, tenderness               Rectovaginal: Confirms               Anus:  normal sphincter tone, no lesions  Chaperone present: yes  A:  Well Woman with normal exam  Post menopausal no HRT  Weight gain working on exercise  Anxiety Lexapro working well desires continuance  Screening labs  Bone density due  P:   Reviewed health and wellness pertinent to exam  Aware of need to advise if vaginal bleeding.  Continue to work on good diet and walking daily for weight management.  Warning signs/risks/benefits discussed.  Rx Lexapro See order with instructions.  Labs: Lipid panel, CBC, TSH, CMP, Vitamin D  Discussed bone density and reason for evaluation. Patient will call when she is ready to schedule.  Pap smear: no  counseled on breast self exam, mammography screening, menopause, osteoporosis, adequate intake of calcium and vitamin D, diet and exercise  return annually or prn  An After Visit Summary was printed and given to the patient.

## 2019-03-28 ENCOUNTER — Ambulatory Visit: Payer: BLUE CROSS/BLUE SHIELD | Admitting: Certified Nurse Midwife

## 2019-03-29 ENCOUNTER — Ambulatory Visit: Payer: BC Managed Care – PPO | Admitting: Certified Nurse Midwife

## 2019-03-29 ENCOUNTER — Other Ambulatory Visit: Payer: Self-pay

## 2019-03-29 ENCOUNTER — Encounter: Payer: Self-pay | Admitting: Certified Nurse Midwife

## 2019-03-29 VITALS — BP 118/78 | HR 68 | Temp 97.2°F | Resp 16 | Ht 62.75 in | Wt 200.0 lb

## 2019-03-29 DIAGNOSIS — Z01419 Encounter for gynecological examination (general) (routine) without abnormal findings: Secondary | ICD-10-CM

## 2019-03-29 DIAGNOSIS — Z0000 Encounter for general adult medical examination without abnormal findings: Secondary | ICD-10-CM

## 2019-03-29 DIAGNOSIS — R635 Abnormal weight gain: Secondary | ICD-10-CM

## 2019-03-29 DIAGNOSIS — Z780 Asymptomatic menopausal state: Secondary | ICD-10-CM

## 2019-03-29 DIAGNOSIS — E559 Vitamin D deficiency, unspecified: Secondary | ICD-10-CM

## 2019-03-29 NOTE — Patient Instructions (Signed)
EXERCISE AND DIET:  We recommended that you start or continue a regular exercise program for good health. Regular exercise means any activity that makes your heart beat faster and makes you sweat.  We recommend exercising at least 30 minutes per day at least 3 days a week, preferably 4 or 5.  We also recommend a diet low in fat and sugar.  Inactivity, poor dietary choices and obesity can cause diabetes, heart attack, stroke, and kidney damage, among others.    ALCOHOL AND SMOKING:  Women should limit their alcohol intake to no more than 7 drinks/beers/glasses of wine (combined, not each!) per week. Moderation of alcohol intake to this level decreases your risk of breast cancer and liver damage. And of course, no recreational drugs are part of a healthy lifestyle.  And absolutely no smoking or even second hand smoke. Most people know smoking can cause heart and lung diseases, but did you know it also contributes to weakening of your bones? Aging of your skin?  Yellowing of your teeth and nails?  CALCIUM AND VITAMIN D:  Adequate intake of calcium and Vitamin D are recommended.  The recommendations for exact amounts of these supplements seem to change often, but generally speaking 600 mg of calcium (either carbonate or citrate) and 800 units of Vitamin D per day seems prudent. Certain women may benefit from higher intake of Vitamin D.  If you are among these women, your doctor will have told you during your visit.    PAP SMEARS:  Pap smears, to check for cervical cancer or precancers,  have traditionally been done yearly, although recent scientific advances have shown that most women can have pap smears less often.  However, every woman still should have a physical exam from her gynecologist every year. It will include a breast check, inspection of the vulva and vagina to check for abnormal growths or skin changes, a visual exam of the cervix, and then an exam to evaluate the size and shape of the uterus and  ovaries.  And after 61 years of age, a rectal exam is indicated to check for rectal cancers. We will also provide age appropriate advice regarding health maintenance, like when you should have certain vaccines, screening for sexually transmitted diseases, bone density testing, colonoscopy, mammograms, etc.   MAMMOGRAMS:  All women over 40 years old should have a yearly mammogram. Many facilities now offer a "3D" mammogram, which may cost around $50 extra out of pocket. If possible,  we recommend you accept the option to have the 3D mammogram performed.  It both reduces the number of women who will be called back for extra views which then turn out to be normal, and it is better than the routine mammogram at detecting truly abnormal areas.    COLONOSCOPY:  Colonoscopy to screen for colon cancer is recommended for all women at age 50.  We know, you hate the idea of the prep.  We agree, BUT, having colon cancer and not knowing it is worse!!  Colon cancer so often starts as a polyp that can be seen and removed at colonscopy, which can quite literally save your life!  And if your first colonoscopy is normal and you have no family history of colon cancer, most women don't have to have it again for 10 years.  Once every ten years, you can do something that may end up saving your life, right?  We will be happy to help you get it scheduled when you are ready.    Be sure to check your insurance coverage so you understand how much it will cost.  It may be covered as a preventative service at no cost, but you should check your particular policy.      Bone Density Test The bone density test uses a special type of X-ray to measure the amount of calcium and other minerals in your bones. It can measure bone density in the hip and the spine. The test procedure is similar to having a regular X-ray. This test may also be called:  Bone densitometry.  Bone mineral density test.  Dual-energy X-ray absorptiometry (DEXA). You  may have this test to:  Diagnose a condition that causes weak or thin bones (osteoporosis).  Screen you for osteoporosis.  Predict your risk for a broken bone (fracture).  Determine how well your osteoporosis treatment is working. Tell a health care provider about:  Any allergies you have.  All medicines you are taking, including vitamins, herbs, eye drops, creams, and over-the-counter medicines.  Any problems you or family members have had with anesthetic medicines.  Any blood disorders you have.  Any surgeries you have had.  Any medical conditions you have.  Whether you are pregnant or may be pregnant.  Any medical tests you have had within the past 14 days that used contrast material. What are the risks? Generally, this is a safe procedure. However, it does expose you to a small amount of radiation, which can slightly increase your cancer risk. What happens before the procedure?  Do not take any calcium supplements starting 24 hours before your test.  Remove all metal jewelry, eyeglasses, dental appliances, and any other metal objects. What happens during the procedure?   You will lie down on an exam table. There will be an X-ray generator below you and an imaging device above you.  Other devices, such as boxes or braces, may be used to position your body properly for the scan.  The machine will slowly scan your body. You will need to keep still.  The images will show up on a screen in the room. Images will be examined by a specialist after your test is done. The procedure may vary among health care providers and hospitals. What happens after the procedure?  It is up to you to get your test results. Ask your health care provider, or the department that is doing the test, when your results will be ready. Summary  A bone density test is an imaging test that uses a type of X-ray to measure the amount of calcium and other minerals in your bones.  The test may be used  to diagnose or screen you for a condition that causes weak or thin bones (osteoporosis), predict your risk for a broken bone (fracture), or determine how well your osteoporosis treatment is working.  Do not take any calcium supplements starting 24 hours before your test.  Ask your health care provider, or the department that is doing the test, when your results will be ready. This information is not intended to replace advice given to you by your health care provider. Make sure you discuss any questions you have with your health care provider. Document Released: 06/07/2004 Document Revised: 06/01/2017 Document Reviewed: 03/20/2017 Elsevier Patient Education  2020 [employer]  

## 2019-03-30 LAB — LIPID PANEL
Chol/HDL Ratio: 3.4 ratio (ref 0.0–4.4)
Cholesterol, Total: 199 mg/dL (ref 100–199)
HDL: 59 mg/dL (ref >39)
LDL Chol Calc (NIH): 130 mg/dL — ABNORMAL HIGH (ref 0–99)
Triglycerides: 54 mg/dL (ref 0–149)
VLDL Cholesterol Cal: 10 mg/dL (ref 5–40)

## 2019-03-30 LAB — COMPREHENSIVE METABOLIC PANEL
ALT: 14 IU/L (ref 0–32)
AST: 16 IU/L (ref 0–40)
Albumin/Globulin Ratio: 1.3 (ref 1.2–2.2)
Albumin: 3.8 g/dL (ref 3.8–4.9)
Alkaline Phosphatase: 94 IU/L (ref 39–117)
BUN/Creatinine Ratio: 20 (ref 12–28)
BUN: 16 mg/dL (ref 8–27)
Bilirubin Total: 0.3 mg/dL (ref 0.0–1.2)
CO2: 27 mmol/L (ref 20–29)
Calcium: 9.4 mg/dL (ref 8.7–10.3)
Chloride: 105 mmol/L (ref 96–106)
Creatinine, Ser: 0.81 mg/dL (ref 0.57–1.00)
GFR calc Af Amer: 91 mL/min/{1.73_m2} (ref >59)
GFR calc non Af Amer: 79 mL/min/{1.73_m2} (ref >59)
Globulin, Total: 2.9 g/dL (ref 1.5–4.5)
Glucose: 87 mg/dL (ref 65–99)
Potassium: 4.3 mmol/L (ref 3.5–5.2)
Sodium: 143 mmol/L (ref 134–144)
Total Protein: 6.7 g/dL (ref 6.0–8.5)

## 2019-03-30 LAB — CBC
Hematocrit: 38.3 % (ref 34.0–46.6)
Hemoglobin: 12.6 g/dL (ref 11.1–15.9)
MCH: 30.2 pg (ref 26.6–33.0)
MCHC: 32.9 g/dL (ref 31.5–35.7)
MCV: 92 fL (ref 79–97)
Platelets: 362 10*3/uL (ref 150–450)
RBC: 4.17 x10E6/uL (ref 3.77–5.28)
RDW: 12.7 % (ref 11.7–15.4)
WBC: 4.9 10*3/uL (ref 3.4–10.8)

## 2019-03-30 LAB — TSH: TSH: 1.84 u[IU]/mL (ref 0.450–4.500)

## 2019-03-30 LAB — VITAMIN D 25 HYDROXY (VIT D DEFICIENCY, FRACTURES): Vit D, 25-Hydroxy: 34.9 ng/mL (ref 30.0–100.0)

## 2019-05-16 DIAGNOSIS — Z1389 Encounter for screening for other disorder: Secondary | ICD-10-CM | POA: Diagnosis not present

## 2019-05-16 DIAGNOSIS — Z6834 Body mass index (BMI) 34.0-34.9, adult: Secondary | ICD-10-CM | POA: Diagnosis not present

## 2019-05-16 DIAGNOSIS — E6609 Other obesity due to excess calories: Secondary | ICD-10-CM | POA: Diagnosis not present

## 2019-05-16 DIAGNOSIS — Z0000 Encounter for general adult medical examination without abnormal findings: Secondary | ICD-10-CM | POA: Diagnosis not present

## 2019-05-31 ENCOUNTER — Other Ambulatory Visit: Payer: Self-pay | Admitting: Certified Nurse Midwife

## 2019-05-31 DIAGNOSIS — F5105 Insomnia due to other mental disorder: Secondary | ICD-10-CM

## 2019-05-31 DIAGNOSIS — F418 Other specified anxiety disorders: Secondary | ICD-10-CM

## 2019-06-03 NOTE — Telephone Encounter (Signed)
Medication refill request: lexapro  Last AEX:  03/29/19  Next AEX: nothing scheduled at this time  Last MMG (if hormonal medication request): na  Refill authorized: 90 with 3 RF

## 2019-07-17 DIAGNOSIS — F320 Major depressive disorder, single episode, mild: Secondary | ICD-10-CM | POA: Diagnosis not present

## 2019-07-17 DIAGNOSIS — M47812 Spondylosis without myelopathy or radiculopathy, cervical region: Secondary | ICD-10-CM | POA: Diagnosis not present

## 2019-07-17 DIAGNOSIS — F329 Major depressive disorder, single episode, unspecified: Secondary | ICD-10-CM | POA: Diagnosis not present

## 2019-07-17 DIAGNOSIS — M5481 Occipital neuralgia: Secondary | ICD-10-CM | POA: Diagnosis not present

## 2019-07-17 DIAGNOSIS — Z6835 Body mass index (BMI) 35.0-35.9, adult: Secondary | ICD-10-CM | POA: Diagnosis not present

## 2019-08-20 ENCOUNTER — Encounter: Payer: Self-pay | Admitting: Certified Nurse Midwife

## 2019-10-22 DIAGNOSIS — Z23 Encounter for immunization: Secondary | ICD-10-CM | POA: Diagnosis not present

## 2020-02-12 DIAGNOSIS — Z1231 Encounter for screening mammogram for malignant neoplasm of breast: Secondary | ICD-10-CM | POA: Diagnosis not present

## 2020-03-27 NOTE — Progress Notes (Signed)
64 y.o. G0P0000 [name] here for annual exam.    Patient's last menstrual period was 11/15/2009.          Sexually active: No.  The current method of family planning is tubal ligation.    Exercising: Yes.    Pedal machine Smoker:  no  Health Maintenance: Pap:  02-10-15 neg HPV HR neg, 03-23-18 neg HPV HR neg History of abnormal Pap:  no MMG:  02-12-2020 category b density birads 1:neg Colonoscopy:  2010 f/u 89yrs (not done) BMD:   none TDaP:  2012 Pneumonia vaccine(s):  Not done Shingrix:   Not done Hep C testing: neg 2017 Gardasil: n/a Covid vaccination: J & J done end of may 2021 Screening Labs:    reports that she has never smoked. She has never used smokeless tobacco. She reports current alcohol use. She reports that she does not use drugs.  Past Medical History:  Diagnosis Date  . Dysmenorrhea   . Irregular menses   . Vulvar abscess 11/05/2012, 07/2014   Culture was + for Staph Aureus    Past Surgical History:  Procedure Laterality Date  . ANKLE ARTHODESIS W/ ARTHROSCOPY Right 04/2010  . BREAST SURGERY Left 06/1998   breast biopsy -benign  . CERVICAL POLYPECTOMY  05/16/2005   benign  . COLONOSCOPY  05/2008  . ENDOMETRIAL BIOPSY  01/05/2005   Endo polyp with fragmented endo breakdown  . TUBAL LIGATION  05/16/05    Current Outpatient Medications  Medication Sig Dispense Refill  . acetaminophen (TYLENOL) 325 MG tablet Take 650 mg by mouth every 6 (six) hours as needed for pain.    Marland Kitchen aspirin 81 MG tablet Take 81 mg by mouth as needed for pain.    . calcium carbonate (TUMS) 500 MG chewable tablet Chew 1 tablet by mouth as needed.     . Cholecalciferol (VITAMIN D) 2000 UNITS CAPS Take by mouth daily.    Marland Kitchen escitalopram (LEXAPRO) 10 MG tablet TAKE 1 TABLET BY MOUTH EVERY DAY 90 tablet 3  . FIBER ADULT GUMMIES PO Take by mouth daily.    . Fish Oil-Krill Oil CAPS Take by mouth.    . Multiple Vitamins-Minerals (MULTIVITAMIN PO) Take by mouth daily.     . Multiple Vitamins-Minerals (ZINC PO) Take by mouth.    . naproxen (NAPROSYN) 500 MG tablet Take 500 mg by mouth as needed.     . Probiotic Product (PROBIOTIC PO) Take by mouth daily.    . Simethicone (GAS-X PO) Take by mouth as needed.     No current facility-administered medications for this visit.    Family History  Problem Relation Age of Onset  . Heart failure Mother   . Cancer Mother 39       unknown  . Heart disease Mother 29       heart valve replaced  - hx of heart valve problem most of life  . Leukemia Father     Review of Systems  Constitutional: Negative.   HENT: Negative.   Eyes: Negative.   Respiratory: Negative.   Cardiovascular: Negative.   Gastrointestinal: Negative.   Endocrine: Negative.   Genitourinary: Negative.   Musculoskeletal: Negative.   Skin: Negative.   Allergic/Immunologic: Negative.   Neurological: Negative.   Psychiatric/Behavioral: Negative.     Exam:   BP 118/80   Pulse 70   Resp 16   Ht 5\' 3"  (1.6 m)   Wt 200 lb (90.7 kg)   LMP 11/15/2009   BMI  35.43 kg/m   Height: 5\' 3"  (160 cm)  General appearance: alert, cooperative and appears stated age Head: Normocephalic, without obvious abnormality, atraumatic Neck: no adenopathy, supple, symmetrical, trachea midline and thyroid normal to inspection and palpation Lungs: clear to auscultation bilaterally Breasts: normal appearance, no masses or tenderness, Normal to palpation without dominant masses Heart: regular rate and rhythm Abdomen: soft, non-tender; bowel sounds normal; no masses,  no organomegaly Extremities: extremities normal, atraumatic, no cyanosis or edema Skin: Skin color, texture, turgor normal. No rashes or lesions Lymph nodes: Cervical, supraclavicular, and axillary nodes normal. No abnormal inguinal nodes palpated Neurologic: Grossly normal   Pelvic: External genitalia:  no lesions              Urethra:  normal appearing urethra with no masses, tenderness or  lesions              Bartholins and Skenes: normal                 Vagina: normal appearing vagina with normal color and discharge, no lesions              Cervix: no cervical motion tenderness and no lesions              Pap taken: No. Bimanual Exam:  Uterus:  normal              Adnexa: no mass, fullness, tenderness               Rectovaginal: Confirms               Anus:  normal sphincter tone, no lesions  Chaperone, , RN was present for exam.  A:  Well Woman with normal exam  Reports doing well with anxiety on lexapro and desires to continue  P:   Mammogram done 01/2020, pt will schedule next year  Pap smear/HPV done 2019. She does not feel comfortable waiting 5 years, can offer cytology every 3 years. Cytology due 2022 Labs to be drawn today: cbc, cmp, tsh, A1C, lipid panel. Pt prefers to have drawn here (she does every year) Medication refill: lexapro 10mg

## 2020-03-31 ENCOUNTER — Other Ambulatory Visit: Payer: Self-pay

## 2020-03-31 ENCOUNTER — Encounter: Payer: Self-pay | Admitting: Nurse Practitioner

## 2020-03-31 ENCOUNTER — Ambulatory Visit: Payer: BC Managed Care – PPO | Admitting: Nurse Practitioner

## 2020-03-31 VITALS — BP 118/80 | HR 70 | Resp 16 | Ht 63.0 in | Wt 200.0 lb

## 2020-03-31 DIAGNOSIS — Z01419 Encounter for gynecological examination (general) (routine) without abnormal findings: Secondary | ICD-10-CM

## 2020-03-31 DIAGNOSIS — F5105 Insomnia due to other mental disorder: Secondary | ICD-10-CM | POA: Diagnosis not present

## 2020-03-31 DIAGNOSIS — E785 Hyperlipidemia, unspecified: Secondary | ICD-10-CM | POA: Diagnosis not present

## 2020-03-31 DIAGNOSIS — F418 Other specified anxiety disorders: Secondary | ICD-10-CM | POA: Diagnosis not present

## 2020-03-31 DIAGNOSIS — R7303 Prediabetes: Secondary | ICD-10-CM | POA: Diagnosis not present

## 2020-03-31 MED ORDER — ESCITALOPRAM OXALATE 10 MG PO TABS: 10.0000 mg | ORAL_TABLET | Freq: Every day | ORAL | 3 refills | Status: DC

## 2020-04-01 LAB — COMPREHENSIVE METABOLIC PANEL
ALT: 14 IU/L (ref 0–32)
AST: 19 IU/L (ref 0–40)
Albumin/Globulin Ratio: 1.4 (ref 1.2–2.2)
Albumin: 4.2 g/dL (ref 3.8–4.8)
Alkaline Phosphatase: 101 IU/L (ref 44–121)
BUN/Creatinine Ratio: 14 (ref 12–28)
BUN: 12 mg/dL (ref 8–27)
Bilirubin Total: 0.3 mg/dL (ref 0.0–1.2)
CO2: 27 mmol/L (ref 20–29)
Calcium: 9.7 mg/dL (ref 8.7–10.3)
Chloride: 100 mmol/L (ref 96–106)
Creatinine, Ser: 0.83 mg/dL (ref 0.57–1.00)
GFR calc Af Amer: 88 mL/min/{1.73_m2} (ref >59)
GFR calc non Af Amer: 76 mL/min/{1.73_m2} (ref >59)
Globulin, Total: 3.1 g/dL (ref 1.5–4.5)
Glucose: 86 mg/dL (ref 65–99)
Potassium: 4.4 mmol/L (ref 3.5–5.2)
Sodium: 140 mmol/L (ref 134–144)
Total Protein: 7.3 g/dL (ref 6.0–8.5)

## 2020-04-01 LAB — LIPID PANEL WITH LDL/HDL RATIO
Cholesterol, Total: 222 mg/dL — ABNORMAL HIGH (ref 100–199)
HDL: 62 mg/dL (ref >39)
LDL Chol Calc (NIH): 146 mg/dL — ABNORMAL HIGH (ref 0–99)
LDL/HDL Ratio: 2.4 ratio (ref 0.0–3.2)
Triglycerides: 81 mg/dL (ref 0–149)
VLDL Cholesterol Cal: 14 mg/dL (ref 5–40)

## 2020-04-01 LAB — CBC
Hematocrit: 39.9 % (ref 34.0–46.6)
Hemoglobin: 13.4 g/dL (ref 11.1–15.9)
MCH: 30.5 pg (ref 26.6–33.0)
MCHC: 33.6 g/dL (ref 31.5–35.7)
MCV: 91 fL (ref 79–97)
Platelets: 409 10*3/uL (ref 150–450)
RBC: 4.39 x10E6/uL (ref 3.77–5.28)
RDW: 12.7 % (ref 11.7–15.4)
WBC: 6.6 10*3/uL (ref 3.4–10.8)

## 2020-04-01 LAB — HEMOGLOBIN A1C
Est. average glucose Bld gHb Est-mCnc: 117 mg/dL
Hgb A1c MFr Bld: 5.7 % — ABNORMAL HIGH (ref 4.8–5.6)

## 2020-04-01 LAB — TSH: TSH: 1.61 u[IU]/mL (ref 0.450–4.500)

## 2020-05-19 DIAGNOSIS — Z6833 Body mass index (BMI) 33.0-33.9, adult: Secondary | ICD-10-CM | POA: Diagnosis not present

## 2020-05-19 DIAGNOSIS — E6609 Other obesity due to excess calories: Secondary | ICD-10-CM | POA: Diagnosis not present

## 2020-05-19 DIAGNOSIS — Z0000 Encounter for general adult medical examination without abnormal findings: Secondary | ICD-10-CM | POA: Diagnosis not present

## 2020-05-19 DIAGNOSIS — R7309 Other abnormal glucose: Secondary | ICD-10-CM | POA: Diagnosis not present

## 2021-02-17 DIAGNOSIS — Z1231 Encounter for screening mammogram for malignant neoplasm of breast: Secondary | ICD-10-CM | POA: Diagnosis not present

## 2021-02-18 ENCOUNTER — Encounter: Payer: Self-pay | Admitting: Obstetrics and Gynecology

## 2021-03-20 ENCOUNTER — Ambulatory Visit
Admission: EM | Admit: 2021-03-20 | Discharge: 2021-03-20 | Disposition: A | Discharge status: Home / Self Care | Payer: BC Managed Care – PPO | Attending: Family Medicine | Admitting: Family Medicine

## 2021-03-20 ENCOUNTER — Other Ambulatory Visit: Payer: Self-pay

## 2021-03-20 DIAGNOSIS — H6121 Impacted cerumen, right ear: Secondary | ICD-10-CM

## 2021-03-20 NOTE — ED Provider Notes (Signed)
  [facility]   [id number] 03/20/21 Arrival Time: 1036  ASSESSMENT & PLAN:  1. Impacted cerumen of right ear    Reports some improvement after ear flushing. May use OTC Debrox and return if not significantly improving over next sev days. No signs of ear infection.  Reviewed expectations re: course of current medical issues. Questions answered. Outlined signs and symptoms indicating need for more acute intervention. Understanding verbalized. After Visit Summary given.   SUBJECTIVE: History from: patient. Heather Moore is a 65 y.o. female who reports "clogged" R ear; x 2 d; h/o similar that needed flushing. No trauma. Otherwise well.  OBJECTIVE:  Vitals:   03/20/21 1105 03/20/21 1106  BP:  114/70  Pulse:  65  Resp:  18  Temp:  98.6 F (37 C)  TempSrc:  Oral  SpO2:  95%  Weight: 86.2 kg   Height: 5\' 3"  (1.6 m)     General appearance: alert; no distress Eyes: PERRLA; EOMI; conjunctiva normal HENT: [place]; AT; without nasal congestion: R EAC with cerumen impaction; very narrow ear canals Neck: supple  Psychological: alert and cooperative; normal mood and affect    Allergies  Allergen Reactions   Hydrocodone Anaphylaxis   Sulfa Antibiotics Nausea Only    Past Medical History:  Diagnosis Date   Dysmenorrhea    Irregular menses    Vulvar abscess 11/05/2012, 07/2014   Culture was + for Staph Aureus   Social History   Socioeconomic History   Marital status: Widowed    Spouse name: Not on file   Number of children: Not on file   Years of education: Not on file   Highest education level: Not on file  Occupational History   Not on file  Tobacco Use   Smoking status: Never   Smokeless tobacco: Never  Vaping Use   Vaping Use: Never used  Substance and Sexual Activity   Alcohol use: Yes    Alcohol/week: 0.0 - 1.0 standard drinks   Drug use: No   Sexual activity: Not Currently    Partners: Male    Birth control/protection: Post-menopausal, Surgical     Comment: BTL  Other Topics Concern   Not on file  Social History Narrative   Not on file   Social Determinants of Health   Financial Resource Strain: Not on file  Food Insecurity: Not on file  Transportation Needs: Not on file  Physical Activity: Not on file  Stress: Not on file  Social Connections: Not on file  Intimate Partner Violence: Not on file   Family History  Problem Relation Age of Onset   Heart failure Mother    Cancer Mother 44       unknown   Heart disease Mother 23       heart valve replaced  - hx of heart valve problem most of life   Leukemia Father    Past Surgical History:  Procedure Laterality Date   ANKLE ARTHODESIS W/ ARTHROSCOPY Right 04/2010   BREAST SURGERY Left 06/1998   breast biopsy -benign   CERVICAL POLYPECTOMY  05/16/2005   benign   COLONOSCOPY  05/2008   ENDOMETRIAL BIOPSY  01/05/2005   Endo polyp with fragmented endo breakdown   TUBAL LIGATION  05/16/05     05/18/05, MD 03/20/21 1214

## 2021-03-20 NOTE — ED Triage Notes (Signed)
Pt states that her right ear is clogged. X2 days

## 2021-04-02 ENCOUNTER — Ambulatory Visit: Payer: BC Managed Care – PPO | Admitting: Nurse Practitioner

## 2021-04-05 ENCOUNTER — Other Ambulatory Visit (HOSPITAL_COMMUNITY)
Admission: RE | Admit: 2021-04-05 | Discharge: 2021-04-05 | Disposition: A | Discharge status: Home / Self Care | Payer: BC Managed Care – PPO | Source: Ambulatory Visit | Attending: Nurse Practitioner | Admitting: Nurse Practitioner

## 2021-04-05 ENCOUNTER — Other Ambulatory Visit: Payer: Self-pay | Admitting: *Deleted

## 2021-04-05 ENCOUNTER — Ambulatory Visit (INDEPENDENT_AMBULATORY_CARE_PROVIDER_SITE_OTHER): Payer: BC Managed Care – PPO | Admitting: Nurse Practitioner

## 2021-04-05 ENCOUNTER — Encounter: Payer: Self-pay | Admitting: Nurse Practitioner

## 2021-04-05 ENCOUNTER — Other Ambulatory Visit: Payer: Self-pay

## 2021-04-05 VITALS — BP 124/82 | Ht 63.0 in | Wt 198.0 lb

## 2021-04-05 DIAGNOSIS — E785 Hyperlipidemia, unspecified: Secondary | ICD-10-CM

## 2021-04-05 DIAGNOSIS — Z780 Asymptomatic menopausal state: Secondary | ICD-10-CM | POA: Diagnosis not present

## 2021-04-05 DIAGNOSIS — Z01419 Encounter for gynecological examination (general) (routine) without abnormal findings: Secondary | ICD-10-CM

## 2021-04-05 DIAGNOSIS — R7303 Prediabetes: Secondary | ICD-10-CM | POA: Diagnosis not present

## 2021-04-05 DIAGNOSIS — F418 Other specified anxiety disorders: Secondary | ICD-10-CM

## 2021-04-05 DIAGNOSIS — F5105 Insomnia due to other mental disorder: Secondary | ICD-10-CM

## 2021-04-05 MED ORDER — ESCITALOPRAM OXALATE 10 MG PO TABS: 10.0000 mg | ORAL_TABLET | Freq: Every day | ORAL | 3 refills | Status: DC

## 2021-04-05 NOTE — Progress Notes (Signed)
   Heather Moore 1956/05/21 [id number]   History:  65 y.o. G0 presents for annual exam without GYN complaints. Postmenopausal - no HRT, no bleeding. Normal pap and mammogram history. Anxiety managed well on Lexapro.   Gynecologic History Patient's last menstrual period was 11/15/2009.   Contraception/Family planning: post menopausal status and tubal ligation Sexually active: Yes  Health Maintenance Last Pap: 03/23/2018. Results were: Normal Last mammogram: 02/18/2021. Results were: Normal Last colonoscopy: 2010. Results were: Normal Last Dexa: Not indicated  Past medical history, past surgical history, family history and social history were all reviewed and documented in the EPIC chart. Widowed. Retired. Mother with history of colon cancer.   ROS:  A ROS was performed and pertinent positives and negatives are included.  Exam:  Vitals:   04/05/21 1053  BP: 124/82  Weight: 198 lb (89.8 kg)  Height: 5\' 3"  (1.6 m)   Body mass index is 35.07 kg/m.  General appearance:  Normal Thyroid:  Symmetrical, normal in size, without palpable masses or nodularity. Respiratory  Auscultation:  Clear without wheezing or rhonchi Cardiovascular  Auscultation:  Regular rate, without rubs, murmurs or gallops  Edema/varicosities:  Not grossly evident Abdominal  Soft,nontender, without masses, guarding or rebound.  Liver/spleen:  No organomegaly noted  Hernia:  None appreciated  Skin  Inspection:  Grossly normal Breasts: Examined lying and sitting.   Right: Without masses, retractions, nipple discharge or axillary adenopathy.   Left: Without masses, retractions, nipple discharge or axillary adenopathy. Genitourinary   Inguinal/mons:  Normal without inguinal adenopathy  External genitalia:  Normal appearing vulva with no masses, tenderness, or lesions  BUS/Urethra/Skene's glands:  Normal  Vagina:  Normal appearing with normal color and discharge, no lesions  Cervix:  Normal appearing  without discharge or lesions  Uterus:  Normal in size, shape and contour.  Midline and mobile, nontender  Adnexa/parametria:     Rt: Normal in size, without masses or tenderness.   Lt: Normal in size, without masses or tenderness.  Anus and perineum: Normal  Digital rectal exam: Normal sphincter tone without palpated masses or tenderness  Patient informed chaperone available to be present for breast and pelvic exam. Patient has requested no chaperone to be present. Patient has been advised what will be completed during breast and pelvic exam.   Assessment/Plan:  65 y.o. G0 for annual exam.   Well female exam with routine gynecological exam - Plan: CBC with Differential/Platelet, Comprehensive metabolic panel, TSH, Cytology - PAP( [facility]). Education provided on SBEs, importance of preventative screenings, current guidelines, high calcium diet, regular exercise, and multivitamin daily.   Postmenopausal - no HRT, no bleeding.   Hyperlipidemia, unspecified hyperlipidemia type - Plan: Lipid panel  Pre-diabetes - Plan: Hemoglobin A1c. 5.7 one year ago. Recommend low carb/low sugar diet and regular exercise.   Screening for cervical cancer - Normal Pap history. Requests pap more often than 5 years. Pap with reflex today.   Screening for breast cancer - Normal mammogram history.  Continue annual screenings.  Normal breast exam today.  Screening for colon cancer - Overdue for colonoscopy. Plans to schedule this soon but has to find a ride. Mother with history of colon cancer. Discussed the importance of preventative screenings especially with family history.   Screening for osteoporosis - Average risk. Will plan DXA at age 58.   Return in 1 year for annual.   76 DNP, 11:04 AM 04/05/2021

## 2021-04-05 NOTE — Telephone Encounter (Signed)
Patient had annual exam with you today.

## 2021-04-06 LAB — CBC WITH DIFFERENTIAL/PLATELET
Absolute Monocytes: 453 cells/uL (ref 200–950)
Basophils Absolute: 22 cells/uL (ref 0–200)
Basophils Relative: 0.5 %
Eosinophils Absolute: 110 cells/uL (ref 15–500)
Eosinophils Relative: 2.5 %
HCT: 39.4 % (ref 35.0–45.0)
Hemoglobin: 13 g/dL (ref 11.7–15.5)
Lymphs Abs: 1509 cells/uL (ref 850–3900)
MCH: 30.4 pg (ref 27.0–33.0)
MCHC: 33 g/dL (ref 32.0–36.0)
MCV: 92.1 fL (ref 80.0–100.0)
MPV: 9.9 fL (ref 7.5–12.5)
Monocytes Relative: 10.3 %
Neutro Abs: 2306 cells/uL (ref 1500–7800)
Neutrophils Relative %: 52.4 %
Platelets: 367 10*3/uL (ref 140–400)
RBC: 4.28 10*6/uL (ref 3.80–5.10)
RDW: 12.5 % (ref 11.0–15.0)
Total Lymphocyte: 34.3 %
WBC: 4.4 10*3/uL (ref 3.8–10.8)

## 2021-04-06 LAB — COMPREHENSIVE METABOLIC PANEL
AG Ratio: 1.4 (calc) (ref 1.0–2.5)
ALT: 17 U/L (ref 6–29)
AST: 19 U/L (ref 10–35)
Albumin: 4 g/dL (ref 3.6–5.1)
Alkaline phosphatase (APISO): 84 U/L (ref 37–153)
BUN: 10 mg/dL (ref 7–25)
CO2: 31 mmol/L (ref 20–32)
Calcium: 9.2 mg/dL (ref 8.6–10.4)
Chloride: 107 mmol/L (ref 98–110)
Creat: 0.93 mg/dL (ref 0.50–1.05)
Globulin: 2.9 g/dL (calc) (ref 1.9–3.7)
Glucose, Bld: 87 mg/dL (ref 65–99)
Potassium: 4.5 mmol/L (ref 3.5–5.3)
Sodium: 144 mmol/L (ref 135–146)
Total Bilirubin: 0.4 mg/dL (ref 0.2–1.2)
Total Protein: 6.9 g/dL (ref 6.1–8.1)

## 2021-04-06 LAB — HEMOGLOBIN A1C
Hgb A1c MFr Bld: 5.5 % of total Hgb (ref <5.7)
Mean Plasma Glucose: 111 mg/dL
eAG (mmol/L): 6.2 mmol/L

## 2021-04-06 LAB — LIPID PANEL
Cholesterol: 194 mg/dL (ref <200)
HDL: 53 mg/dL (ref >=50)
LDL Cholesterol (Calc): 120 mg/dL (calc) — ABNORMAL HIGH
Non-HDL Cholesterol (Calc): 141 mg/dL (calc) — ABNORMAL HIGH (ref <130)
Total CHOL/HDL Ratio: 3.7 (calc) (ref <5.0)
Triglycerides: 100 mg/dL (ref <150)

## 2021-04-06 LAB — TSH: TSH: 1.32 mIU/L (ref 0.40–4.50)

## 2021-04-06 LAB — CYTOLOGY - PAP
Adequacy: ABSENT
Diagnosis: NEGATIVE

## 2021-05-20 DIAGNOSIS — E6609 Other obesity due to excess calories: Secondary | ICD-10-CM | POA: Diagnosis not present

## 2021-05-20 DIAGNOSIS — Z0000 Encounter for general adult medical examination without abnormal findings: Secondary | ICD-10-CM | POA: Diagnosis not present

## 2021-05-20 DIAGNOSIS — Z23 Encounter for immunization: Secondary | ICD-10-CM | POA: Diagnosis not present

## 2021-05-20 DIAGNOSIS — Z6836 Body mass index (BMI) 36.0-36.9, adult: Secondary | ICD-10-CM | POA: Diagnosis not present

## 2021-05-20 DIAGNOSIS — F320 Major depressive disorder, single episode, mild: Secondary | ICD-10-CM | POA: Diagnosis not present

## 2021-05-20 DIAGNOSIS — Z1331 Encounter for screening for depression: Secondary | ICD-10-CM | POA: Diagnosis not present

## 2022-02-23 DIAGNOSIS — Z1231 Encounter for screening mammogram for malignant neoplasm of breast: Secondary | ICD-10-CM | POA: Diagnosis not present

## 2022-02-25 ENCOUNTER — Encounter: Payer: Self-pay | Admitting: Nurse Practitioner

## 2022-03-29 ENCOUNTER — Other Ambulatory Visit: Payer: Self-pay | Admitting: Nurse Practitioner

## 2022-03-29 DIAGNOSIS — F418 Other specified anxiety disorders: Secondary | ICD-10-CM

## 2022-04-06 ENCOUNTER — Encounter: Payer: Self-pay | Admitting: Nurse Practitioner

## 2022-04-06 ENCOUNTER — Ambulatory Visit (INDEPENDENT_AMBULATORY_CARE_PROVIDER_SITE_OTHER): Payer: BC Managed Care – PPO | Admitting: Nurse Practitioner

## 2022-04-06 VITALS — BP 118/88 | HR 61 | Ht 63.5 in | Wt 206.0 lb

## 2022-04-06 DIAGNOSIS — E7800 Pure hypercholesterolemia, unspecified: Secondary | ICD-10-CM | POA: Diagnosis not present

## 2022-04-06 DIAGNOSIS — Z87898 Personal history of other specified conditions: Secondary | ICD-10-CM

## 2022-04-06 DIAGNOSIS — Z01419 Encounter for gynecological examination (general) (routine) without abnormal findings: Secondary | ICD-10-CM

## 2022-04-06 DIAGNOSIS — F418 Other specified anxiety disorders: Secondary | ICD-10-CM

## 2022-04-06 DIAGNOSIS — F5105 Insomnia due to other mental disorder: Secondary | ICD-10-CM

## 2022-04-06 DIAGNOSIS — Z780 Asymptomatic menopausal state: Secondary | ICD-10-CM | POA: Diagnosis not present

## 2022-04-06 MED ORDER — ESCITALOPRAM OXALATE 10 MG PO TABS: 10.0000 mg | ORAL_TABLET | Freq: Every day | ORAL | 3 refills | Status: DC

## 2022-04-06 NOTE — Progress Notes (Signed)
   Heather Moore 06-25-1955 [id number]   History:  66 y.o. G0 presents for annual exam without GYN complaints. Postmenopausal - no HRT, no bleeding. Normal pap and mammogram history. Anxiety managed well on Lexapro.   Gynecologic History Patient's last menstrual period was 11/15/2009.   Contraception/Family planning: post menopausal status and tubal ligation Sexually active: Yes  Health Maintenance Last Pap: 04/05/2021. Results were: Normal Last mammogram: 02/23/2022. Results were: Normal Last colonoscopy: 2010. Results were: Normal Last Dexa: Not indicated  Past medical history, past surgical history, family history and social history were all reviewed and documented in the EPIC chart. Widowed. Retired. Mother with history of colon cancer.   ROS:  A ROS was performed and pertinent positives and negatives are included.  Exam:  Vitals:   04/06/22 1123  BP: 118/88  Pulse: 61  SpO2: 96%  Weight: 206 lb (93.4 kg)  Height: 5' 3.5" (1.613 m)   Body mass index is 35.92 kg/m.  General appearance:  Normal Thyroid:  Symmetrical, normal in size, without palpable masses or nodularity. Respiratory  Auscultation:  Clear without wheezing or rhonchi Cardiovascular  Auscultation:  Regular rate, without rubs, murmurs or gallops  Edema/varicosities:  Not grossly evident Abdominal  Soft,nontender, without masses, guarding or rebound.  Liver/spleen:  No organomegaly noted  Hernia:  None appreciated  Skin  Inspection:  Grossly normal Breasts: Examined lying and sitting.   Right: Without masses, retractions, nipple discharge or axillary adenopathy.   Left: Without masses, retractions, nipple discharge or axillary adenopathy. Genitourinary   Inguinal/mons:  Normal without inguinal adenopathy  External genitalia:  Normal appearing vulva with no masses, tenderness, or lesions  BUS/Urethra/Skene's glands:  Normal  Vagina:  Normal appearing with normal color and discharge, no  lesions  Cervix:  Normal appearing without discharge or lesions  Uterus:  Normal in size, shape and contour.  Midline and mobile, nontender  Adnexa/parametria:     Rt: Normal in size, without masses or tenderness.   Lt: Normal in size, without masses or tenderness.  Anus and perineum: Normal  Digital rectal exam: Normal sphincter tone without palpated masses or tenderness  Patient informed chaperone available to be present for breast and pelvic exam. Patient has requested no chaperone to be present. Patient has been advised what will be completed during breast and pelvic exam.   Assessment/Plan:  66 y.o. G0 for annual exam.   Well female exam with routine gynecological exam - Plan: CBC with Differential/Platelet, Comprehensive metabolic panel. Education provided on SBEs, importance of preventative screenings, current guidelines, high calcium diet, regular exercise, and multivitamin daily.   Postmenopausal - no HRT, no bleeding.   History of prediabetes - Plan: Hemoglobin A1c  Elevated LDL cholesterol level - Plan: Lipid panel  Insomnia secondary to depression with anxiety - Plan: escitalopram (LEXAPRO) 10 MG tablet daily. Doing well on this. Refill x 1 year provided.   Screening for cervical cancer - Normal Pap history. Will repeat at 3-year interval per guidelines.   Screening for breast cancer - Normal mammogram history.  Continue annual screenings.  Normal breast exam today.  Screening for colon cancer - Overdue for colonoscopy. Discussed the importance of preventative screenings especially with family history. Mother with history of colon cancer. She plans to schedule this soon.   Screening for osteoporosis - Average risk. Will plan DXA at age 6.   Return in 1 year for annual.     [name] DNP, 11:29 AM 04/06/2022

## 2022-04-07 LAB — CBC WITH DIFFERENTIAL/PLATELET
Absolute Monocytes: 532 cells/uL (ref 200–950)
Basophils Absolute: 50 cells/uL (ref 0–200)
Basophils Relative: 0.9 %
Eosinophils Absolute: 129 cells/uL (ref 15–500)
Eosinophils Relative: 2.3 %
HCT: 38.5 % (ref 35.0–45.0)
Hemoglobin: 12.9 g/dL (ref 11.7–15.5)
Lymphs Abs: 1551 cells/uL (ref 850–3900)
MCH: 30.3 pg (ref 27.0–33.0)
MCHC: 33.5 g/dL (ref 32.0–36.0)
MCV: 90.4 fL (ref 80.0–100.0)
MPV: 9.8 fL (ref 7.5–12.5)
Monocytes Relative: 9.5 %
Neutro Abs: 3338 cells/uL (ref 1500–7800)
Neutrophils Relative %: 59.6 %
Platelets: 376 10*3/uL (ref 140–400)
RBC: 4.26 10*6/uL (ref 3.80–5.10)
RDW: 12.4 % (ref 11.0–15.0)
Total Lymphocyte: 27.7 %
WBC: 5.6 10*3/uL (ref 3.8–10.8)

## 2022-04-07 LAB — COMPREHENSIVE METABOLIC PANEL
AG Ratio: 1.4 (calc) (ref 1.0–2.5)
ALT: 16 U/L (ref 6–29)
AST: 19 U/L (ref 10–35)
Albumin: 4 g/dL (ref 3.6–5.1)
Alkaline phosphatase (APISO): 98 U/L (ref 37–153)
BUN: 15 mg/dL (ref 7–25)
CO2: 29 mmol/L (ref 20–32)
Calcium: 9.5 mg/dL (ref 8.6–10.4)
Chloride: 104 mmol/L (ref 98–110)
Creat: 0.8 mg/dL (ref 0.50–1.05)
Globulin: 2.9 g/dL (calc) (ref 1.9–3.7)
Glucose, Bld: 88 mg/dL (ref 65–99)
Potassium: 4.4 mmol/L (ref 3.5–5.3)
Sodium: 141 mmol/L (ref 135–146)
Total Bilirubin: 0.4 mg/dL (ref 0.2–1.2)
Total Protein: 6.9 g/dL (ref 6.1–8.1)

## 2022-04-07 LAB — LIPID PANEL
Cholesterol: 213 mg/dL — ABNORMAL HIGH (ref <200)
HDL: 65 mg/dL (ref >=50)
LDL Cholesterol (Calc): 131 mg/dL (calc) — ABNORMAL HIGH
Non-HDL Cholesterol (Calc): 148 mg/dL (calc) — ABNORMAL HIGH (ref <130)
Total CHOL/HDL Ratio: 3.3 (calc) (ref <5.0)
Triglycerides: 75 mg/dL (ref <150)

## 2022-04-07 LAB — HEMOGLOBIN A1C
Hgb A1c MFr Bld: 5.8 % of total Hgb — ABNORMAL HIGH (ref <5.7)
Mean Plasma Glucose: 120 mg/dL
eAG (mmol/L): 6.6 mmol/L

## 2022-05-26 ENCOUNTER — Encounter (INDEPENDENT_AMBULATORY_CARE_PROVIDER_SITE_OTHER): Payer: Self-pay | Admitting: *Deleted

## 2022-05-26 DIAGNOSIS — F320 Major depressive disorder, single episode, mild: Secondary | ICD-10-CM | POA: Diagnosis not present

## 2022-05-26 DIAGNOSIS — Z6837 Body mass index (BMI) 37.0-37.9, adult: Secondary | ICD-10-CM | POA: Diagnosis not present

## 2022-05-26 DIAGNOSIS — Z13220 Encounter for screening for lipoid disorders: Secondary | ICD-10-CM | POA: Diagnosis not present

## 2022-05-26 DIAGNOSIS — E6609 Other obesity due to excess calories: Secondary | ICD-10-CM | POA: Diagnosis not present

## 2022-05-26 DIAGNOSIS — Z1331 Encounter for screening for depression: Secondary | ICD-10-CM | POA: Diagnosis not present

## 2022-05-26 DIAGNOSIS — R7309 Other abnormal glucose: Secondary | ICD-10-CM | POA: Diagnosis not present

## 2022-05-26 DIAGNOSIS — Z0000 Encounter for general adult medical examination without abnormal findings: Secondary | ICD-10-CM | POA: Diagnosis not present

## 2022-05-26 DIAGNOSIS — E782 Mixed hyperlipidemia: Secondary | ICD-10-CM | POA: Diagnosis not present

## 2022-10-31 ENCOUNTER — Encounter (INDEPENDENT_AMBULATORY_CARE_PROVIDER_SITE_OTHER): Payer: Self-pay | Admitting: *Deleted

## 2022-11-16 DIAGNOSIS — H2513 Age-related nuclear cataract, bilateral: Secondary | ICD-10-CM | POA: Diagnosis not present

## 2022-11-16 DIAGNOSIS — H1045 Other chronic allergic conjunctivitis: Secondary | ICD-10-CM | POA: Diagnosis not present

## 2023-03-01 DIAGNOSIS — Z1231 Encounter for screening mammogram for malignant neoplasm of breast: Secondary | ICD-10-CM | POA: Diagnosis not present

## 2023-03-02 ENCOUNTER — Encounter: Payer: Self-pay | Admitting: Obstetrics and Gynecology

## 2023-03-26 ENCOUNTER — Other Ambulatory Visit: Payer: Self-pay | Admitting: Nurse Practitioner

## 2023-03-26 DIAGNOSIS — F418 Other specified anxiety disorders: Secondary | ICD-10-CM

## 2023-03-27 NOTE — Telephone Encounter (Signed)
Med refill request: Lexapro Last AEX: 04/06/2022-TW Next AEX: 04/19/2023-TW Last MMG (if hormonal med): n/a Refill authorized: rx pend.

## 2023-04-12 ENCOUNTER — Ambulatory Visit: Payer: BC Managed Care – PPO | Admitting: Nurse Practitioner

## 2023-04-19 ENCOUNTER — Encounter: Payer: Self-pay | Admitting: Nurse Practitioner

## 2023-04-19 ENCOUNTER — Ambulatory Visit (INDEPENDENT_AMBULATORY_CARE_PROVIDER_SITE_OTHER): Payer: Self-pay | Admitting: Nurse Practitioner

## 2023-04-19 VITALS — BP 122/78 | HR 58 | Ht 63.25 in | Wt 207.0 lb

## 2023-04-19 DIAGNOSIS — F419 Anxiety disorder, unspecified: Secondary | ICD-10-CM

## 2023-04-19 DIAGNOSIS — Z780 Asymptomatic menopausal state: Secondary | ICD-10-CM

## 2023-04-19 DIAGNOSIS — F32A Depression, unspecified: Secondary | ICD-10-CM

## 2023-04-19 DIAGNOSIS — Z01419 Encounter for gynecological examination (general) (routine) without abnormal findings: Secondary | ICD-10-CM

## 2023-04-19 MED ORDER — ESCITALOPRAM OXALATE 10 MG PO TABS: 10.0000 mg | ORAL_TABLET | Freq: Every day | ORAL | 4 refills | Status: DC

## 2023-04-19 NOTE — Progress Notes (Signed)
Heather Moore 02-01-1956 [id number]   History:  67 y.o. G0 presents for annual exam without GYN complaints. Postmenopausal - no HRT, no bleeding. Normal pap and mammogram history. Anxiety managed well on Lexapro.   Gynecologic History Patient's last menstrual period was 11/15/2009.   Contraception/Family planning: post menopausal status and tubal ligation Sexually active: Yes  Health Maintenance Last Pap: 04/05/2021. Results were: Normal, 3-year repeat Last mammogram: 03/01/2023. Results were: Normal Last colonoscopy: 2010. Results were: Normal Last Dexa: Not indicated  Past medical history, past surgical history, family history and social history were all reviewed and documented in the EPIC chart. Widowed. Retired. Mother with history of colon cancer.   ROS:  A ROS was performed and pertinent positives and negatives are included.  Exam:  Vitals:   04/19/23 1030  BP: 122/78  Pulse: (!) 58  SpO2: 99%  Weight: 207 lb (93.9 kg)  Height: 5' 3.25" (1.607 m)    Body mass index is 36.38 kg/m.  General appearance:  Normal Thyroid:  Symmetrical, normal in size, without palpable masses or nodularity. Respiratory  Auscultation:  Clear without wheezing or rhonchi Cardiovascular  Auscultation:  Regular rate, without rubs, murmurs or gallops  Edema/varicosities:  Not grossly evident Abdominal  Soft,nontender, without masses, guarding or rebound.  Liver/spleen:  No organomegaly noted  Hernia:  None appreciated  Skin  Inspection:  Grossly normal Breasts: Examined lying and sitting.   Right: Without masses, retractions, nipple discharge or axillary adenopathy.   Left: Without masses, retractions, nipple discharge or axillary adenopathy. Pelvic: External genitalia:  no lesions              Urethra:  normal appearing urethra with no masses, tenderness or lesions              Bartholins and Skenes: normal                 Vagina: normal appearing vagina with normal color and  discharge, no lesions. Atrophic changes              Cervix: no lesions Bimanual Exam:  Uterus:  no masses or tenderness              Adnexa: no mass, fullness, tenderness              Rectovaginal: Deferred              Anus:  normal, no lesions  Patient informed chaperone available to be present for breast and pelvic exam. Patient has requested no chaperone to be present. Patient has been advised what will be completed during breast and pelvic exam.   Assessment/Plan:  67 y.o. G0 for annual exam.   Well female exam with routine gynecological exam Education provided on SBEs, importance of preventative screenings, current guidelines, high calcium diet, regular exercise, and multivitamin daily. Planning to get labs with PCP.   Postmenopausal - no HRT, no bleeding   Anxiety and depression - Plan: escitalopram (LEXAPRO) 10 MG tablet daily. Doing well on this. Refill x 1 year provided.   Screening for cervical cancer - Normal Pap history. Will repeat at 3-year interval per guidelines.   Screening for breast cancer - Normal mammogram history.  Continue annual screenings.  Normal breast exam today.  Screening for colon cancer - Overdue for colonoscopy. Discussed the importance of preventative screenings especially with family history. Mother with history of colon cancer. She plans to schedule this soon.   Screening for osteoporosis - Average risk. Will  plan DXA at age 62.   Return in about 1 year (around 04/18/2024) for Annual.     [name] DNP, 10:46 AM 04/19/2023

## 2023-06-08 DIAGNOSIS — R7309 Other abnormal glucose: Secondary | ICD-10-CM | POA: Diagnosis not present

## 2023-06-08 DIAGNOSIS — Z0001 Encounter for general adult medical examination with abnormal findings: Secondary | ICD-10-CM | POA: Diagnosis not present

## 2023-06-08 DIAGNOSIS — Z1331 Encounter for screening for depression: Secondary | ICD-10-CM | POA: Diagnosis not present

## 2023-06-08 DIAGNOSIS — Z6837 Body mass index (BMI) 37.0-37.9, adult: Secondary | ICD-10-CM | POA: Diagnosis not present

## 2023-06-08 DIAGNOSIS — F320 Major depressive disorder, single episode, mild: Secondary | ICD-10-CM | POA: Diagnosis not present

## 2023-06-08 DIAGNOSIS — E6609 Other obesity due to excess calories: Secondary | ICD-10-CM | POA: Diagnosis not present

## 2023-06-14 ENCOUNTER — Encounter (INDEPENDENT_AMBULATORY_CARE_PROVIDER_SITE_OTHER): Payer: Self-pay | Admitting: *Deleted

## 2023-10-18 ENCOUNTER — Other Ambulatory Visit: Payer: Self-pay | Admitting: Family Medicine

## 2023-10-18 DIAGNOSIS — Z0001 Encounter for general adult medical examination with abnormal findings: Secondary | ICD-10-CM

## 2023-11-03 ENCOUNTER — Ambulatory Visit (HOSPITAL_COMMUNITY)
Admission: RE | Admit: 2023-11-03 | Discharge: 2023-11-03 | Disposition: A | Discharge status: Home / Self Care | Source: Ambulatory Visit | Attending: Family Medicine | Admitting: Family Medicine

## 2023-11-03 DIAGNOSIS — Z0001 Encounter for general adult medical examination with abnormal findings: Secondary | ICD-10-CM

## 2023-11-03 DIAGNOSIS — Z780 Asymptomatic menopausal state: Secondary | ICD-10-CM | POA: Diagnosis not present

## 2023-11-03 DIAGNOSIS — Z13820 Encounter for screening for osteoporosis: Secondary | ICD-10-CM | POA: Insufficient documentation

## 2023-12-13 ENCOUNTER — Encounter (INDEPENDENT_AMBULATORY_CARE_PROVIDER_SITE_OTHER): Payer: Self-pay | Admitting: *Deleted

## 2024-02-05 DIAGNOSIS — K529 Noninfective gastroenteritis and colitis, unspecified: Secondary | ICD-10-CM | POA: Diagnosis not present

## 2024-02-05 DIAGNOSIS — R197 Diarrhea, unspecified: Secondary | ICD-10-CM | POA: Diagnosis not present

## 2024-03-01 DIAGNOSIS — Z1231 Encounter for screening mammogram for malignant neoplasm of breast: Secondary | ICD-10-CM | POA: Diagnosis not present

## 2024-03-01 LAB — HM MAMMOGRAPHY

## 2024-03-05 ENCOUNTER — Ambulatory Visit: Payer: Self-pay | Admitting: Obstetrics and Gynecology

## 2024-04-19 ENCOUNTER — Ambulatory Visit (INDEPENDENT_AMBULATORY_CARE_PROVIDER_SITE_OTHER): Admitting: Nurse Practitioner

## 2024-04-19 ENCOUNTER — Encounter: Payer: Self-pay | Admitting: Nurse Practitioner

## 2024-04-19 ENCOUNTER — Other Ambulatory Visit (HOSPITAL_COMMUNITY)
Admission: RE | Admit: 2024-04-19 | Discharge: 2024-04-19 | Disposition: A | Discharge status: Home / Self Care | Source: Ambulatory Visit | Attending: Nurse Practitioner | Admitting: Nurse Practitioner

## 2024-04-19 VITALS — BP 104/72 | HR 65 | Ht 62.5 in | Wt 210.0 lb

## 2024-04-19 DIAGNOSIS — F419 Anxiety disorder, unspecified: Secondary | ICD-10-CM

## 2024-04-19 DIAGNOSIS — Z01419 Encounter for gynecological examination (general) (routine) without abnormal findings: Secondary | ICD-10-CM | POA: Insufficient documentation

## 2024-04-19 DIAGNOSIS — Z780 Asymptomatic menopausal state: Secondary | ICD-10-CM | POA: Diagnosis not present

## 2024-04-19 DIAGNOSIS — Z1151 Encounter for screening for human papillomavirus (HPV): Secondary | ICD-10-CM | POA: Insufficient documentation

## 2024-04-19 DIAGNOSIS — F32A Depression, unspecified: Secondary | ICD-10-CM

## 2024-04-19 DIAGNOSIS — Z124 Encounter for screening for malignant neoplasm of cervix: Secondary | ICD-10-CM

## 2024-04-19 MED ORDER — ESCITALOPRAM OXALATE 10 MG PO TABS: 10.0000 mg | ORAL_TABLET | Freq: Every day | ORAL | 4 refills | Status: AC

## 2024-04-19 NOTE — Progress Notes (Signed)
 Heather Moore [birth year] [id number]   History:  68 y.o. G0 presents for breast and pelvic exam. Postmenopausal - no HRT, no bleeding. Normal pap history. Anxiety managed well on Lexapro .   Gynecologic History Patient's last menstrual period was 11/15/2009.   Contraception/Family planning: post menopausal status and tubal ligation Sexually active: Yes  Health Maintenance Last Pap: 04/05/2021. Results were: Normal Last mammogram: 03/01/2024. Results were: Normal Last colonoscopy: 2010. Results were: Normal Last Dexa: 2025. Normal per patient  Past medical history, past surgical history, family history and social history were all reviewed and documented in the EPIC chart. Widowed. Retired. Mother with history of colon cancer. 66 yo beagle has foot cancer, scheduled for amputation.   ROS:  A ROS was performed and pertinent positives and negatives are included.  Exam:  Vitals:   04/19/24 1013  BP: 104/72  Pulse: 65  SpO2: 97%  Weight: 210 lb (95.3 kg)  Height: 5' 2.5 (1.588 m)     Body mass index is 37.8 kg/m.  General appearance:  Normal Thyroid :  Symmetrical, normal in size, without palpable masses or nodularity. Respiratory  Auscultation:  Clear without wheezing or rhonchi Cardiovascular  Auscultation:  Regular rate, without rubs, murmurs or gallops  Edema/varicosities:  Not grossly evident Abdominal  Soft,nontender, without masses, guarding or rebound.  Liver/spleen:  No organomegaly noted  Hernia:  None appreciated  Skin  Inspection:  Grossly normal Breasts: Examined lying and sitting.   Right: Without masses, retractions, nipple discharge or axillary adenopathy.   Left: Without masses, retractions, nipple discharge or axillary adenopathy. Pelvic: External genitalia:  no lesions              Urethra:  normal appearing urethra with no masses, tenderness or lesions              Bartholins and Skenes: normal                 Vagina: normal appearing vagina with  normal color and discharge, no lesions. Atrophic changes              Cervix: no lesions Bimanual Exam:  Uterus:  no masses or tenderness              Adnexa: no mass, fullness, tenderness              Rectovaginal: Deferred              Anus:  normal, no lesions  Heather Moore, CMA present as chaperone.   Assessment/Plan:  68 y.o. G0 for annual exam.   Encounter for breast and pelvic examination - Education provided on SBEs, importance of preventative screenings, current guidelines, high calcium diet, regular exercise, and multivitamin daily. Labs with PCP.   Postmenopausal - no HRT, no bleeding   Anxiety and depression - Plan: escitalopram  (LEXAPRO ) 10 MG tablet daily. Doing well on this. Refill x 1 year provided.    Cervical cancer screening - Plan: Cytology - PAP( [place]). Normal Pap history. Pap today per guidelines.   Screening for breast cancer - Normal mammogram history.  Continue annual screenings.  Normal breast exam today.  Screening for colon cancer - Overdue for colonoscopy. Discussed the importance of preventative screenings especially with family history. Mother with history of colon cancer. She plans to schedule this soon.   Screening for osteoporosis - Normal DXA this year. Managed by PCP.   Return in about 2 years (around 04/19/2026) for B&P.     Heather Moore A  Heather Moore, 10:41 AM 04/19/2024

## 2024-04-22 LAB — CYTOLOGY - PAP
Adequacy: ABSENT
Comment: NEGATIVE
Diagnosis: NEGATIVE
High risk HPV: NEGATIVE

## 2024-04-23 ENCOUNTER — Ambulatory Visit: Payer: Self-pay | Admitting: Nurse Practitioner
